# Patient Record
Sex: Male | Born: 1975
Health system: Southern US, Community
[De-identification: ages and names within clinical notes are randomized; demographics above are authoritative.]

## PROBLEM LIST (undated history)

## (undated) DIAGNOSIS — F513 Sleepwalking [somnambulism]: Secondary | ICD-10-CM

## (undated) DIAGNOSIS — I10 Essential (primary) hypertension: Secondary | ICD-10-CM

## (undated) DIAGNOSIS — Z720 Tobacco use: Secondary | ICD-10-CM

## (undated) DIAGNOSIS — Z87891 Personal history of nicotine dependence: Secondary | ICD-10-CM

## (undated) HISTORY — PX: WISDOM TOOTH EXTRACTION: SHX21

## (undated) HISTORY — DX: Essential (primary) hypertension: I10

## (undated) HISTORY — DX: Tobacco use: Z72.0

## (undated) HISTORY — DX: Personal history of nicotine dependence: Z87.891

---

## 2005-06-22 ENCOUNTER — Ambulatory Visit (HOSPITAL_BASED_OUTPATIENT_CLINIC_OR_DEPARTMENT_OTHER): Admission: RE | Admit: 2005-06-22 | Discharge: 2005-06-22 | Payer: Self-pay | Admitting: Orthopedic Surgery

## 2005-06-22 ENCOUNTER — Ambulatory Visit (HOSPITAL_COMMUNITY): Admission: RE | Admit: 2005-06-22 | Discharge: 2005-06-22 | Payer: Self-pay | Admitting: Orthopedic Surgery

## 2005-06-25 HISTORY — PX: FOREIGN BODY REMOVAL: SHX962

## 2006-08-01 ENCOUNTER — Ambulatory Visit (HOSPITAL_BASED_OUTPATIENT_CLINIC_OR_DEPARTMENT_OTHER): Admission: RE | Admit: 2006-08-01 | Discharge: 2006-08-01 | Payer: Self-pay | Admitting: Orthopedic Surgery

## 2006-08-01 ENCOUNTER — Encounter (INDEPENDENT_AMBULATORY_CARE_PROVIDER_SITE_OTHER): Payer: Self-pay | Admitting: *Deleted

## 2008-10-21 ENCOUNTER — Encounter (HOSPITAL_COMMUNITY): Admission: RE | Admit: 2008-10-21 | Discharge: 2008-11-20 | Payer: Self-pay | Admitting: Pediatrics

## 2010-05-06 ENCOUNTER — Emergency Department (HOSPITAL_COMMUNITY)
Admission: EM | Admit: 2010-05-06 | Discharge: 2010-05-06 | Payer: Self-pay | Source: Home / Self Care | Admitting: Emergency Medicine

## 2010-05-06 ENCOUNTER — Encounter: Payer: Self-pay | Admitting: Orthopedic Surgery

## 2010-05-08 ENCOUNTER — Ambulatory Visit: Payer: Self-pay | Admitting: Orthopedic Surgery

## 2010-05-08 DIAGNOSIS — IMO0002 Reserved for concepts with insufficient information to code with codable children: Secondary | ICD-10-CM

## 2010-05-08 DIAGNOSIS — S62309A Unspecified fracture of unspecified metacarpal bone, initial encounter for closed fracture: Secondary | ICD-10-CM | POA: Insufficient documentation

## 2010-05-08 DIAGNOSIS — S82109A Unspecified fracture of upper end of unspecified tibia, initial encounter for closed fracture: Secondary | ICD-10-CM

## 2010-05-08 DIAGNOSIS — S62329A Displaced fracture of shaft of unspecified metacarpal bone, initial encounter for closed fracture: Secondary | ICD-10-CM | POA: Insufficient documentation

## 2010-05-10 ENCOUNTER — Telehealth: Payer: Self-pay | Admitting: Orthopedic Surgery

## 2010-05-10 ENCOUNTER — Encounter (HOSPITAL_COMMUNITY)
Admission: RE | Admit: 2010-05-10 | Discharge: 2010-06-09 | Payer: Self-pay | Source: Home / Self Care | Attending: Orthopedic Surgery | Admitting: Orthopedic Surgery

## 2010-05-24 ENCOUNTER — Ambulatory Visit: Payer: Self-pay | Admitting: Orthopedic Surgery

## 2010-06-14 ENCOUNTER — Ambulatory Visit: Payer: Self-pay | Admitting: Orthopedic Surgery

## 2010-07-06 ENCOUNTER — Ambulatory Visit
Admission: RE | Admit: 2010-07-06 | Discharge: 2010-07-06 | Payer: Self-pay | Source: Home / Self Care | Attending: Orthopedic Surgery | Admitting: Orthopedic Surgery

## 2010-07-25 NOTE — Progress Notes (Signed)
Summary: call from therapist  Phone Note Other Incoming   Caller: Occupational therapist Summary of Call: Beth, occupational therapist Jeani Hawking, calling about patient's splint per order.  Needs to clarify degree of flexion.  He is in her office now. [facility]) Initial call taken by: Cammie Sickle,  May 10, 2010 10:46 AM

## 2010-07-25 NOTE — Letter (Signed)
Summary: History form  History form   Imported By: Jacklynn Ganong 05/10/2010 13:25:16  _____________________________________________________________________  External Attachment:    Type:   Image     Comment:   External Document

## 2010-07-25 NOTE — Miscellaneous (Signed)
Summary: Clinical Eval OT  Clinical Eval OT   Imported By: Eugenio Hoes 05/31/2010 16:29:55  _____________________________________________________________________  External Attachment:    Type:   Image     Comment:   External Document

## 2010-07-25 NOTE — Assessment & Plan Note (Signed)
Summary: [facility] ER FX RT FIBIA/FX LEFT HAND/BCBS/BSF   Visit Type:  new patient Referring Provider:  [facility]  CC:  left hand fracture and right knee fracture.  History of Present Illness: I saw Luis Chang in the office today for an initial visit.  He is a 35 years old man with the complaint of:  Left hand fracture and right tibial plateau fracture.  DOI 05-06-10.  Xrays taken [facility] ER.   Medications given by ER: Ibuprofen 800 mg, Norco 5mg .   I reviewed his x-rays that showed that the patient has a lateral plateau fracture, RIGHT, minimal depression.  He also has a index finger , proximal phalanx fracture, and a third metacarpal fracture.  He complains of dull, stabbing pain, which is 6/10 and relieved by Norco 5 mg and ibuprofen. His pain is constant and he was injured falling down some basement stairs on November 12. Initial treatment in the emergency room included crutches. Straight leg brace on the RIGHT splint for the LEFT hand  Allergies (verified): No Known Drug Allergies  Past History:  Past Surgical History: Hand surgery 2006  Review of Systems Constitutional:  Denies weight loss, weight gain, fever, chills, and fatigue. Cardiovascular:  Denies chest pain, palpitations, fainting, and murmurs. Respiratory:  Denies short of breath, wheezing, couch, tightness, pain on inspiration, and snoring . Gastrointestinal:  Denies heartburn, nausea, vomiting, diarrhea, constipation, and blood in your stools. Genitourinary:  Denies frequency, urgency, difficulty urinating, painful urination, flank pain, and bleeding in urine. Neurologic:  Denies numbness, tingling, unsteady gait, dizziness, tremors, and seizure. Musculoskeletal:  Denies joint pain, swelling, instability, stiffness, redness, heat, and muscle pain. Endocrine:  Denies excessive thirst, exessive urination, and heat or cold intolerance. Psychiatric:  Denies nervousness, depression, anxiety, and hallucinations. Skin:  Denies  changes in the skin, poor healing, rash, itching, and redness. HEENT:  Denies blurred or double vision, eye pain, redness, and watering. Immunology:  Denies seasonal allergies, sinus problems, and allergic to bee stings. Hemoatologic:  Denies easy bleeding and brusing.  Physical Exam  Skin:  intact without lesions or rashes, mild abrasions from the fall, none-full-thickness Cervical Nodes:  no significant adenopathy Psych:  alert and cooperative; normal mood and affect; normal attention span and concentration Additional Exam:  tenderness and swelling of the LEFT hand at the index finger, proximal phalanx in the mid to distal shaft of the 3rd metacarpal. There is no deformity.  Rotatory alignment was checked with the passive tenodesis test of both hands.     Knee Exam  General:    Well-developed, well-nourished, normal body habitus; no deformities, normal grooming.  Gait:    abnormal gait pattern with crutches, favoring the RIGHT lower extremity  Inspection:    swelling noted. RIGHT knee with effusion, tenderness over the lateral compartment.  Tenderness over the 2nd proximal phalanx (index finger) and tenderness over the 3rd metacarpal. Mid-distal shaft  Vascular:    There was no swelling or varicose veins. The pulses and temperature are normal. There was no edema or tenderness.  Sensory:    Gross coordination and sensation were normal.    Motor:    Motor strength 5/5 bilaterally for quadriceps, hamstrings, ankle dorsiflexion, and ankle plantar flexion.    Reflexes:    no reflexes were taken  Knee Exam:    Right:    Inspection:  Abnormal    Palpation:  Abnormal    Stability:  stable   Impression & Recommendations:  Problem # 1:  FRACTURE, TIBIAL PLATEAU (  ICD-823.00) Assessment New  recommended range of motion brace, RIGHT knee 0-70 with touchdown weightbearing with crutches for 2 weeks and repeat x-ray.  Splints for the LEFT hand to be made by the hospital. He  is placed in temporary finger splints with aluminum splint today.  Orders: New Patient Level IV ([address])  Problem # 2:  CLOSED FRACTURE OF SHAFT OF METACARPAL BONE (ICD-815.03) Assessment: New  Orders: New Patient Level IV (60454)  Problem # 3:  CLOS FRACTURE MID/PROXIMAL PHALANX/PHALANG HAND (ICD-816.01) Assessment: New  Orders: New Patient Level IV (09811)  Other Orders: Physical Therapy Referral (PT)  Patient Instructions: 1)  Return in 2 weeks for xrays of the left hand and right knee include 10 caudal tilt  2)  toe touch WB with crutches x 2 weeks  3)  Hand splint at Hospital    Orders Added: 1)  Physical Therapy Referral [PT] 2)  New Patient Level IV [91478]

## 2010-07-25 NOTE — Assessment & Plan Note (Signed)
Summary: 2 WK RE-CK/XRAYS LT HAND+RT KNEE,INCLUDE 10 CAUD TILT/BCBS/CAF   Visit Type:  Follow-up Referring Provider:  [facility]  CC:  left hand and right knee fracture.  History of Present Illness: I saw Luis Chang in the office today for a followup visit.  He is a 35 years old man with the complaint of:  left hand and right knee   Left hand fracture and right tibial plateau fracture.  DOI 05-06-10. [18 days later]  Medications given by ER: Ibuprofen 800 mg, Norco 5mg .  he is not using any pain medication. Right now is doing well. He hasn't moved his knee very much despite his brace being a 0-70 he is ambulating with crutches in a hand-based splint for his index finger and long finger fractures. The index fingers. A proximal phalanx fracture and the long finger is a metacarpal neck fracture.  X-rays are obtained of the tibial plateau with 10 caudal tilt showing no depression of the fracture at this point.  The hand shows 2 fractures at the proximal phalanx of the index finger and the head of the 3rd metacarpal or long finger, which are nondisplaced.  The patient can progressively weight-bear as tolerated with protection of brace and crutches and keep his hand-based splint on him when he comes back in 3 weeks. X-rays will be repeated and then we will remove the splint from the hand to start active range of motion exercises and removed. The crutches and use the brace only for an additional 6 weeks  Allergies: No Known Drug Allergies   Impression & Recommendations:  Problem # 1:  FRACTURE, TIBIAL PLATEAU (ICD-823.00)  Orders: Post-Op Check (56213) Knee x-ray,  3 views (08657)  Problem # 2:  CLOS FRACTURE MID/PROXIMAL PHALANX/PHALANG HAND (ICD-816.01)  Orders: Post-Op Check (84696)  Problem # 3:  CLOSED FRACTURE OF SHAFT OF METACARPAL BONE (ICD-815.03)  Orders: Post-Op Check (29528) Hand x-ray, minimum 3 views (73130)  Patient Instructions: 1)  start bending the knee 100 x  a day  2)  wear brace 3 more weeks, start weight bearing  3)  return in 3 weeks 4)  xrays of left hand and right knee    Orders Added: 1)  Post-Op Check [99024] 2)  Knee x-ray,  3 views [73562] 3)  Hand x-ray, minimum 3 views [73130]

## 2010-07-25 NOTE — Letter (Signed)
Summary: Out of Work  [facility]  [address]  [place], [place] [address]   Phone: [phone]  Fax: [phone]    May 08, 2010   Employee:  JENNER ROSIER    To Whom It May Concern:   For Medical reasons, please excuse the above named employee from work for the following dates:  Start:   05/08/10  End/Estimated return to work:   06/23/10 or until further notice   If you need additional information, please feel free to contact our office.         Sincerely,    Terrance Mass, MD

## 2010-07-27 NOTE — Assessment & Plan Note (Signed)
Summary: 3 wk RE-CK/XRAYS LT HAND+RT KNEE/BCBS/CAF   Visit Type:  Follow-up Referring Provider:  [facility]  CC:  left hand and right knee.  History of Present Illness: I saw Luis Chang in the office today for a 3 week followup visit.  He is a 35 years old man with the complaint of:  left hand and right knee  Xrays today.  Left hand fracture and right tibial plateau fracture.  DOI 05-06-10.   Medications: Ibuprofen 800 mg, Norco 5mg .  Today, he is here to recheck the wound range of motion in his RIGHT knee and LEFT hand.  Did not have any difficulties other than some stiffness in the hand.  Exam shows full range of motion without swelling or tenderness in the RIGHT knee. All ligaments are stable.  LEFT hand, there is no rotatory or angular malalignment to the hand. He has near full range of motion with just a little loss of extension at the ring finger.  Overall impression healed tibial plateau fracture, RIGHT knee, and healed fractures of the LEFT hand. Plan discharge follow up as needed  Allergies: No Known Drug Allergies   Impression & Recommendations:  Problem # 1:  CLOSED FRACTURE OF SHAFT OF METACARPAL BONE (ICD-815.03) Assessment Improved  Orders: Post-Op Check ([address])  Problem # 2:  CLOS FRACTURE MID/PROXIMAL PHALANX/PHALANG HAND (ICD-816.01) Assessment: Improved  Orders: Post-Op Check (60454)  Problem # 3:  FRACTURE, TIBIAL PLATEAU (ICD-823.00) Assessment: Improved  Orders: Post-Op Check (09811)  Patient Instructions: 1)  Please schedule a follow-up appointment as needed.   Orders Added: 1)  Post-Op Check [91478]

## 2010-07-27 NOTE — Assessment & Plan Note (Signed)
Summary: 3 wk re-ck/xray LT hand + Rt knee/ bcbs/wkj   Visit Type:  Follow-up Referring Provider:  [facility]  CC:  left hand and right knee.  History of Present Illness: I saw Luis Chang in the office today for a 3 week followup visit.  He is a 35 years old man with the complaint of:  left hand and right knee  Xrays today.  Left hand fracture and right tibial plateau fracture.  DOI 05-06-10.   day 39 today  ~ 6 weeks   Medications given by ER: Ibuprofen 800 mg, Norco 5mg .  The patient has a stable RIGHT knee with full range of motion no swelling or tenderness  X-ray RIGHT knee AP lateral and patella, 10 caudal tilt Lateral plateau fracture no displacement  Impression healed lateral plateau fracture  LEFT hand x-ray proximal phalanx index finger ulnar side fracture and spiral third metacarpal metaphyseal fracture Both fractures nondisplaced no change in position after 6 weeks for impression stable fractures as described   Allergies: No Known Drug Allergies   Impression & Recommendations:  Problem # 1:  CLOSED FRACTURE OF SHAFT OF METACARPAL BONE (ICD-815.03)  the patient's wife tells me he walk without crutches over the last week with the brace on  He should continue bracing additional 3 weeks for transition  He ensures me that he can do his own physical therapy later to his hand of note we checked him for rotation and his rotatory alignment is equal to his opposite hand.  He was concerned about a small bump over the metacarpal head #3 I told this was not enough of an offset to warrant surgery  Return to work  Orders: Post-Op Check [id number]) Hand x-ray, minimum 3 views (73130)  Problem # 2:  CLOS FRACTURE MID/PROXIMAL PHALANX/PHALANG HAND (ICD-816.01)  Orders: Post-Op Check (60454) Hand x-ray, minimum 3 views (73130)  Problem # 3:  FRACTURE, TIBIAL PLATEAU (ICD-823.00)  Orders: Post-Op Check (09811) Knee x-ray complete,  4/more views (91478)   Orders  Added: 1)  Post-Op Check [99024] 2)  Knee x-ray complete,  4/more views [73564] 3)  Hand x-ray, minimum 3 views [73130]

## 2010-11-10 NOTE — Op Note (Signed)
NAME:  RYLYNN, SCHONEMAN                  ACCOUNT NO.:  [id number]   MEDICAL RECORD NO.:  [id number]          PATIENT TYPE:  AMB   LOCATION:  DSC                          FACILITY:  [facility]   PHYSICIAN:  Cindee Salt, M.D.       DATE OF BIRTH:  Jun 11, 1976   DATE OF PROCEDURE:  06/22/2005  DATE OF DISCHARGE:                                 OPERATIVE REPORT   PREOPERATIVE DIAGNOSIS:  Foreign body left hand.   POSTOPERATIVE DIAGNOSIS:  Foreign body left hand.   OPERATION:  Removal foreign body, exploration digital nerve left hand.   SURGEON:  Cindee Salt, M.D.   ASSISTANT:  None.   ANESTHESIA:  General.   HISTORY:  The patient is 35 year old male who suffered an injury to his  right hand when a piece of wood was jammed into it. The injury occurred 1  day prior to being seen. It was seen at a local emergency room where  attempted removal was unsuccessful. He has had numbness and tingling since  that time to the radial digital nerve.   DESCRIPTION OF PROCEDURE:  The patient is brought to the operating room a  general anesthetic carried out without difficulty.  He was prepped and  draped using DuraPrep, supine position, and left arm free. The limb was  exsanguinated with an Esmarch bandage, tourniquet placed high, and the arm  was inflated to 250 mmHg.   An oblique incision was made going through the entrance wound carried  proximally and distally, carried down through subcutaneous tissue. A piece  of wood measuring approximately 1 cm x 0.5 x 0.25 cm in diameter was then  easily removed. The radial digital nerve was then explored. This was found  to be intact. The flexor tendon was not involved. The ulnar digital nerve  was also explored and found to be intact. The wound was copiously irrigated  after cultures were taken for both aerobic and anaerobic cultures.   Following irrigation the incisions were closed. The original entry site was  left open. Sterile compressive dressing was applied.  The patient tolerated  the procedure well; and was taken to the recovery room for observation in  satisfactory condition. He is discharged home to return to the [facility] [place] in 1 week on Vicodin.           ______________________________  Cindee Salt, M.D.     GK/MEDQ  D:  06/22/2005  T:  06/22/2005  Job:  [id number]

## 2010-11-10 NOTE — Op Note (Signed)
NAME:  Luis Chang, Luis Chang                  ACCOUNT NO.:  [id number]   MEDICAL RECORD NO.:  [id number]          PATIENT TYPE:  AMB   LOCATION:  DSC                          FACILITY:  [facility]   PHYSICIAN:  Cindee Salt, M.D.       DATE OF BIRTH:  [birth year]   DATE OF PROCEDURE:  08/01/2006  DATE OF DISCHARGE:                               OPERATIVE REPORT   PREOPERATIVE DIAGNOSIS:  Mass, left index finger.   POSTOPERATIVE DIAGNOSIS:  Mass, left index finger.   OPERATION:  Excision of mass, left index finger.   SURGEON:  Cindee Salt, M.D.   ANESTHESIA:  Forearm based IV regional.   HISTORY:  The patient is a 35 year old male with a history of a mass in  the index finger of his left hand, proximal phalanx.  He is desirous of  removal in that this has become painful and is slowly enlarging.  He is  aware of risks and complications including recurrence, injury to  arteries, nerves, tendons, incomplete relief of symptoms, dystrophy,  plus possibility of infection.  He is desirous of removing this and  questions were encouraged and answered in the preoperative area.  The  extremity marked by both the patient and surgeon.   PROCEDURE:  The patient was brought to the operating room where a  forearm based IV regional anesthetic was carried out without difficulty.  He was prepped using DuraPrep, supine position, left arm free.  After 3-  minute dry time, he was draped.  An oblique incision was made for  extension of a Brunner type incision over the middle phalanx, carried  down through subcutaneous tissue.  A pearly white mass was immediately  encountered.  With blunt and sharp dissection this was dissected free.  The neurovascular bundles were identified and protected.  The specimen  was excised and sent to pathology.  This measured approximately 0.5 cm  in diameter.  It was not opened.  The wound was irrigated.  A metacarpal  block was given with 0.25% Marcaine without epinephrine.  4 mL was used.  The wound was closed after irrigation with interrupted 5-0 nylon  sutures.  Sterile compressive dressing and splint to the finger applied.  The patient tolerated the procedure well.  On deflation of the  tourniquet, all fingers immediately pinked.  He was taken to the  recovery observation in satisfactory condition.  The specimen was sent  to pathology.  He is discharged home to return to [facility] [place] in one week on Vicodin.           ______________________________  Cindee Salt, M.D.     GK/MEDQ  D:  08/01/2006  T:  08/01/2006  Job:  [id number]

## 2011-03-16 ENCOUNTER — Emergency Department: Payer: Self-pay | Admitting: Emergency Medicine

## 2012-02-08 ENCOUNTER — Ambulatory Visit (INDEPENDENT_AMBULATORY_CARE_PROVIDER_SITE_OTHER): Payer: BC Managed Care – PPO | Admitting: Emergency Medicine

## 2012-02-08 ENCOUNTER — Ambulatory Visit: Payer: BC Managed Care – PPO

## 2012-02-08 VITALS — BP 133/83 | HR 75 | Temp 97.9°F | Resp 16 | Ht 71.0 in | Wt 172.6 lb

## 2012-02-08 DIAGNOSIS — M25529 Pain in unspecified elbow: Secondary | ICD-10-CM

## 2012-02-08 DIAGNOSIS — S52599A Other fractures of lower end of unspecified radius, initial encounter for closed fracture: Secondary | ICD-10-CM

## 2012-02-08 DIAGNOSIS — M25539 Pain in unspecified wrist: Secondary | ICD-10-CM

## 2012-02-08 DIAGNOSIS — S52509A Unspecified fracture of the lower end of unspecified radius, initial encounter for closed fracture: Secondary | ICD-10-CM

## 2012-02-08 MED ORDER — HYDROCODONE-ACETAMINOPHEN 5-325 MG PO TABS: 1.0000 | ORAL_TABLET | ORAL | Status: AC | PRN

## 2012-02-08 NOTE — Progress Notes (Signed)
   [facility] [address] [place] [address] Phone: [phone] Fax: [phone]  Date:  02/08/2012   Name:  Luis Chang   DOB:  1976-02-20   MRN:  [id number] Gender: male  Age: 36 y.o.  PCP:  No primary provider on file.    Chief Complaint: Wrist Pain and Hand Pain   History of Present Illness:  Luis Chang is a 36 y.o. pleasant patient who presents with the following:  Playing with kids in yard and raced them to a tree.  Slipped and fell on outstretched arm.  Heard a pop and has pain in right wrist.  Denies other complaints.  Has pain with movement unable to use had at work today    Patient Active Problem List  Diagnosis  . FRACTURE, HAND  . CLOSED FRACTURE OF SHAFT OF METACARPAL BONE  . CLOS FRACTURE MID/PROXIMAL PHALANX/PHALANG HAND  . FRACTURE, TIBIAL PLATEAU    No past medical history on file.  No past surgical history on file.  History  Substance Use Topics  . Smoking status: Current Everyday Smoker -- 2.0 packs/day    Types: Cigarettes  . Smokeless tobacco: Not on file  . Alcohol Use: Not on file    No family history on file.  No Known Allergies  Medication list has been reviewed and updated.  Current Outpatient Prescriptions on File Prior to Visit  Medication Sig Dispense Refill  . lisinopril (PRINIVIL,ZESTRIL) 10 MG tablet Take 10 mg by mouth daily.        Review of Systems:  As per HPI, otherwise negative.    Physical Examination: Filed Vitals:   02/08/12 1511  BP: 133/83  Pulse: 75  Temp: 97.9 F (36.6 C)  Resp: 16   Filed Vitals:   02/08/12 1511  Height: 5\' 11"  (1.803 m)  Weight: 172 lb 9.6 oz (78.291 kg)   Body mass index is 24.07 kg/(m^2). Ideal Body Weight: Weight in (lb) to have BMI = 25: 178.9    GEN: WDWN, NAD, Non-toxic, Alert & Oriented x 3 HEENT: Atraumatic, Normocephalic.  Ears and Nose: No external deformity. EXTR: No clubbing/cyanosis/edema NEURO: Normal gait.  PSYCH: Normally  interactive. Conversant. Not depressed or anxious appearing.  Calm demeanor.  Ext:  Pain and tenderness, swelling right wrist.  No deformity  NATI   Assessment and Plan: Fracture distal radius Sugar tong splint Ortho follow up.  Patient has personal ortho preference and will make appt.  Carmelina Dane, MD   [facility] reading (PRIMARY) by  Dr. Dareen Piano.  Fracture distal radius.

## 2012-02-08 NOTE — Patient Instructions (Addendum)
Wrist Fracture  Your caregiver has diagnosed you as having a fracture of the wrist. A fracture is a break in the bone or bones. A cast or splint is used to protect and keep your injured bone(s) from moving. The cast or splint will usually be on for about 5 to 6 weeks. One of the bones of the wrist (the navicular bone) often does not show up as a fracture on X-ray until later or in the healing phase. With this bone your caregiver will often cast as though it is fractured even if not seen on the X-ray.  HOME CARE INSTRUCTIONS   · To lessen the swelling, keep the injured part elevated while sitting or lying down. Keeping the injury above the level of your heart (the center of the chest) will decrease swelling and pain.  · Do not wear rings or jewelry on the injured hand or wrist.  · Apply ice to the injury for 15 to 20 minutes, 3 to 4 times per day while awake for 2 days. Put the ice in a plastic bag and place a thin towel between the bag of ice and your cast.  · If you have a plaster or fiberglass cast:  · Do not try to scratch the skin under the cast using sharp or pointed objects.  · Check the skin around the cast every day. You may put lotion on any red or sore areas.  · Keep your cast dry and clean.  · If you have a plaster splint:  · Wear the splint as directed.  · You may loosen the elastic around the splint if your fingers become numb, tingle, or turn cold or blue.  · If you have been put in a removable splint, wear and use as directed.  · Do not use powders or deodorants in or around the cast or splint.  · Do not remove padding from your cast or splint.  · Do not put pressure on any part of your cast or splint. It may break. Rest your cast or splint only on a pillow the first 24 hours until it is fully hardened.  · Gently move your fingers often, so they do not get stiff.  · Do not remove the splint unless directed by your caregiver. Casts must be removed by an orthopedist.  · Your cast or splint can be  protected during bathing with a plastic bag. Do not lower the cast or splint into water.  · Only take over-the-counter or prescription medicines for pain, discomfort, or fever as directed by your caregiver.  · Follow up with your caregiver as directed.  SEEK IMMEDIATE MEDICAL CARE IF:   · Your cast or splint gets damaged or breaks.  · Your cast or splint feels too tight or loose.  · You have increased pain, not controlled with medication.  · You have increased swelling.  · Your skin or nails below the injury turn blue or grey or feel cold or numb.  · You have trouble moving or feeling your fingers.  · You experience any burning or stinging from the cast or splint.  · There is a bad smell coming from under the cast or splint.  · New stains or fluids are coming from under the cast or splint.  · You have any new injuries while wearing the cast or splint.  Document Released: 03/21/2005 Document Revised: 05/31/2011 Document Reviewed: 01/08/2007  ExitCare® Patient Information ©2012 [employer].

## 2012-02-13 ENCOUNTER — Ambulatory Visit (INDEPENDENT_AMBULATORY_CARE_PROVIDER_SITE_OTHER): Payer: BC Managed Care – PPO | Admitting: Orthopedic Surgery

## 2012-02-13 ENCOUNTER — Encounter: Payer: Self-pay | Admitting: Orthopedic Surgery

## 2012-02-13 VITALS — BP 116/70 | Ht 71.0 in | Wt 172.0 lb

## 2012-02-13 DIAGNOSIS — S52599A Other fractures of lower end of unspecified radius, initial encounter for closed fracture: Secondary | ICD-10-CM

## 2012-02-13 DIAGNOSIS — M79609 Pain in unspecified limb: Secondary | ICD-10-CM

## 2012-02-13 DIAGNOSIS — M25539 Pain in unspecified wrist: Secondary | ICD-10-CM

## 2012-02-13 DIAGNOSIS — S52509A Unspecified fracture of the lower end of unspecified radius, initial encounter for closed fracture: Secondary | ICD-10-CM

## 2012-02-13 NOTE — Progress Notes (Signed)
  Subjective:    Patient ID: Luis Chang, male    DOB: [birth year], 36 y.o.   MRN: [id number]  Wrist Injury  The incident occurred 5 to 7 days ago. The incident occurred at home. The injury mechanism was a fall. The pain is present in the right wrist. The quality of the pain is described as aching. The pain does not radiate. The pain is at a severity of 3/10. The pain has been fluctuating since the incident.      Review of Systems  All other systems reviewed and are negative.       Objective:   Physical Exam  Nursing note and vitals reviewed. Constitutional: He is oriented to person, place, and time. He appears well-developed and well-nourished.  HENT:  Head: Normocephalic.  Cardiovascular: Normal rate and intact distal pulses.   Pulmonary/Chest: No respiratory distress.  Abdominal: He exhibits no distension.  Neurological: He is alert and oriented to person, place, and time. He has normal reflexes. No cranial nerve deficit. He exhibits normal muscle tone. Coordination normal.  Skin: Skin is warm and dry. No rash noted. No erythema. No pallor.  Psychiatric: He has a normal mood and affect. His behavior is normal. Judgment and thought content normal.  Right Hand Exam  Right hand exam is normal.  Comments:  Right wrist  Tender no swelling  Skin intact ROM PROM is normal  Strength normal  Joint stability normal    Left Hand Exam  Left hand exam is normal.   Right Elbow Exam  Right elbow exam is normal.   Left Elbow Exam  Left elbow exam is normal.   Right Shoulder Exam  Right shoulder exam is normal.   Left Shoulder Exam  Left shoulder exam is normal.       Imaging: Non displaced distal radius fracture      Assessment & Plan:  closedd ND right D radius fracture   Fracture brace / Warm and form x 6 weeks then x rays

## 2012-02-13 NOTE — Patient Instructions (Addendum)
Brace x 6 weeks   Ok to shower

## 2012-03-26 ENCOUNTER — Ambulatory Visit: Payer: BC Managed Care – PPO | Admitting: Orthopedic Surgery

## 2012-03-26 ENCOUNTER — Encounter: Payer: Self-pay | Admitting: Orthopedic Surgery

## 2012-03-26 ENCOUNTER — Telehealth: Payer: Self-pay | Admitting: Orthopedic Surgery

## 2012-03-26 NOTE — Telephone Encounter (Signed)
Patient(wife)called 03/25/12, cancelled appointment for 03/26/12 for now, due to his work schedule.  Offered re-schedule; wife states patient will need to call back when he can arrange it with work.  I also called back to patient's cell #[phone], and left message offering re-schedule; letter sent, as no return call back as of yet.

## 2013-06-12 ENCOUNTER — Ambulatory Visit: Payer: BC Managed Care – PPO | Admitting: Family Medicine

## 2013-08-22 ENCOUNTER — Emergency Department (HOSPITAL_COMMUNITY)
Admission: EM | Admit: 2013-08-22 | Discharge: 2013-08-22 | Disposition: A | Discharge status: Home / Self Care | Payer: BC Managed Care – PPO | Attending: Emergency Medicine | Admitting: Emergency Medicine

## 2013-08-22 ENCOUNTER — Emergency Department (HOSPITAL_COMMUNITY): Payer: BC Managed Care – PPO

## 2013-08-22 ENCOUNTER — Encounter (HOSPITAL_COMMUNITY): Payer: Self-pay | Admitting: Emergency Medicine

## 2013-08-22 DIAGNOSIS — Y9289 Other specified places as the place of occurrence of the external cause: Secondary | ICD-10-CM | POA: Insufficient documentation

## 2013-08-22 DIAGNOSIS — T5291XA Toxic effect of unspecified organic solvent, accidental (unintentional), initial encounter: Secondary | ICD-10-CM | POA: Insufficient documentation

## 2013-08-22 DIAGNOSIS — F17200 Nicotine dependence, unspecified, uncomplicated: Secondary | ICD-10-CM | POA: Insufficient documentation

## 2013-08-22 DIAGNOSIS — T5994XA Toxic effect of unspecified gases, fumes and vapors, undetermined, initial encounter: Secondary | ICD-10-CM

## 2013-08-22 DIAGNOSIS — Y9389 Activity, other specified: Secondary | ICD-10-CM | POA: Insufficient documentation

## 2013-08-22 DIAGNOSIS — I10 Essential (primary) hypertension: Secondary | ICD-10-CM | POA: Insufficient documentation

## 2013-08-22 DIAGNOSIS — T59891A Toxic effect of other specified gases, fumes and vapors, accidental (unintentional), initial encounter: Secondary | ICD-10-CM | POA: Insufficient documentation

## 2013-08-22 DIAGNOSIS — R05 Cough: Secondary | ICD-10-CM | POA: Insufficient documentation

## 2013-08-22 DIAGNOSIS — R059 Cough, unspecified: Secondary | ICD-10-CM | POA: Insufficient documentation

## 2013-08-22 DIAGNOSIS — R0602 Shortness of breath: Secondary | ICD-10-CM | POA: Insufficient documentation

## 2013-08-22 MED ORDER — PREDNISONE 50 MG PO TABS
60.0000 mg | ORAL_TABLET | Freq: Once | ORAL | Status: AC
  Administered 2013-08-22: 60 mg via ORAL
  Filled 2013-08-22 (×2): qty 1

## 2013-08-22 MED ORDER — ALBUTEROL SULFATE (2.5 MG/3ML) 0.083% IN NEBU
5.0000 mg | INHALATION_SOLUTION | Freq: Once | RESPIRATORY_TRACT | Status: AC
  Administered 2013-08-22: 5 mg via RESPIRATORY_TRACT
  Filled 2013-08-22: qty 6

## 2013-08-22 MED ORDER — AEROCHAMBER Z-STAT PLUS/MEDIUM MISC: 1.0000 | Freq: Once | Status: DC

## 2013-08-22 MED ORDER — IPRATROPIUM BROMIDE 0.02 % IN SOLN
0.5000 mg | Freq: Once | RESPIRATORY_TRACT | Status: AC
  Administered 2013-08-22: 0.5 mg via RESPIRATORY_TRACT
  Filled 2013-08-22: qty 2.5

## 2013-08-22 MED ORDER — ALBUTEROL SULFATE HFA 108 (90 BASE) MCG/ACT IN AERS
2.0000 | INHALATION_SPRAY | RESPIRATORY_TRACT | Status: DC | PRN
  Administered 2013-08-22: 2 via RESPIRATORY_TRACT
  Filled 2013-08-22: qty 6.7

## 2013-08-22 MED ORDER — ALBUTEROL (5 MG/ML) CONTINUOUS INHALATION SOLN
10.0000 mg/h | INHALATION_SOLUTION | Freq: Once | RESPIRATORY_TRACT | Status: AC
  Administered 2013-08-22: 10 mg/h via RESPIRATORY_TRACT
  Filled 2013-08-22: qty 20

## 2013-08-22 MED ORDER — ACETAMINOPHEN 325 MG PO TABS
650.0000 mg | ORAL_TABLET | Freq: Once | ORAL | Status: AC
  Administered 2013-08-22: 650 mg via ORAL
  Filled 2013-08-22: qty 2

## 2013-08-22 MED ORDER — PREDNISONE 20 MG PO TABS: ORAL_TABLET | ORAL | Status: DC

## 2013-08-22 NOTE — ED Provider Notes (Signed)
CSN: [id number]     Arrival date & time 08/22/13  1835 History   First MD Initiated Contact with Patient 08/22/13 1839     Chief Complaint  Patient presents with  . Toxic Inhalation     (Consider location/radiation/quality/duration/timing/severity/associated sxs/prior Treatment) HPI Patient reports today they were using industrial bright to clean a motor of some heavy equipment at work. He states he's used it before however today because it was colder they had to keep the shop doors closed. He states he sprayed it on the motor and then blew it off with an air hose. He was doing this for about 20 minutes. This was about 1:30 PM. He states shortly afterwards he started feeling short of breath. He states he coughs when he tries to take a big deep breath. He denies any burning in his throat or oropharynx. He denies any swelling of his tongue or throat. He denies having wheezing. He states he is having a cough with intermittent yellow sputum production and also sometimes dry. He had nausea earlier right after he used a chemical but not now. He denies vomiting or diarrhea. His wife states he came home from work and slept for 2  Hours which is not like him.  Patient smokes 2 packs per day.    PCP Dr Webb LawsHalm's office  Past Medical History  Diagnosis Date  . HTN (hypertension)    Past Surgical History  Procedure Laterality Date  . Foreign body removal  2007    RIGHT HAND  . Wisdom tooth extraction     Family History  Problem Relation Age of Onset  . Arthritis     History  Substance Use Topics  . Smoking status: Current Every Day Smoker -- 2.00 packs/day    Types: Cigarettes  . Smokeless tobacco: Not on file  . Alcohol Use: Yes     Comment: OCCASIONALLY  employed Lives with spouse  Review of Systems  All other systems reviewed and are negative.      Allergies  Review of patient's allergies indicates no known allergies.  Home Medications  none  BP 155/96  Pulse 93  Resp 18   SpO2 96%  Vital signs normal   Physical Exam  Nursing note and vitals reviewed. Constitutional: He is oriented to person, place, and time. He appears well-developed and well-nourished.  Non-toxic appearance. He does not appear ill. No distress.  HENT:  Head: Normocephalic and atraumatic.  Right Ear: External ear normal.  Left Ear: External ear normal.  Nose: Nose normal. No mucosal edema or rhinorrhea.  Mouth/Throat: Oropharynx is clear and moist and mucous membranes are normal. No dental abscesses or uvula swelling.  Diffuse redness of his soft palate c/w smoking  Eyes: Conjunctivae and EOM are normal. Pupils are equal, round, and reactive to light.  Neck: Normal range of motion and full passive range of motion without pain. Neck supple.  Cardiovascular: Normal rate, regular rhythm and normal heart sounds.  Exam reveals no gallop and no friction rub.   No murmur heard. Pulmonary/Chest: Effort normal. No respiratory distress. He has decreased breath sounds. He has no wheezes. He has no rhonchi. He has no rales. He exhibits no tenderness and no crepitus.  Pt unable to breathe even moderately without coughing  Abdominal: Soft. Normal appearance and bowel sounds are normal. He exhibits no distension. There is no tenderness. There is no rebound and no guarding.  Musculoskeletal: Normal range of motion. He exhibits no edema and no tenderness.  Moves  all extremities well.   Neurological: He is alert and oriented to person, place, and time. He has normal strength. No cranial nerve deficit.  Skin: Skin is warm, dry and intact. No rash noted. No erythema. No pallor.  Psychiatric: He has a normal mood and affect. His speech is normal and behavior is normal. His mood appears not anxious.    ED Course  Procedures (including critical care time)  Medications  albuterol (PROVENTIL HFA;VENTOLIN HFA) 108 (90 BASE) MCG/ACT inhaler 2 puff (not administered)  aerochamber Z-Stat Plus/medium 1 each (not  administered)  albuterol (PROVENTIL,VENTOLIN) solution continuous neb (10 mg/hr Nebulization Given 08/22/13 1905)  ipratropium (ATROVENT) nebulizer solution 0.5 mg (0.5 mg Nebulization Given 08/22/13 1905)  acetaminophen (TYLENOL) tablet 650 mg (650 mg Oral Given 08/22/13 2119)  albuterol (PROVENTIL) (2.5 MG/3ML) 0.083% nebulizer solution 5 mg (5 mg Nebulization Given 08/22/13 2123)  ipratropium (ATROVENT) nebulizer solution 0.5 mg (0.5 mg Nebulization Given 08/22/13 2123)  predniSONE (DELTASONE) tablet 60 mg (60 mg Oral Given 08/22/13 2119)    20:10 Pt still getting his continuous nebulizer, feeling better, is able to breathe without coughing. Wife brought in the can of [employer]ource brake parts cleaner made in [place], [place].  20:17 Poison Control, [place] Standard, states is a hydrocarbon, usually acetone, states it is respiratory and mucus membrane irritatant, agrees with nebulizer.   Recheck 21:10 almost finished with continuous nebulizer. Patient has improved deep breathing. He's now able to breathe deeply without coughing. He is noted to have some scattered lower pitched rhonchi. He states he feels better but he still has some shortness of breath. He will get a second regular nebulizer treatment.  Recheck at 2200 patient has improved air movement. He has rare low pitched rhonchi. He is going to be ambulated by nursing staff with pulse ox monitoring. If he does not feel worse he can be discharged.  His pulse ox was 97-99% on RA. Pt ready for discharge.   Labs Review Labs Reviewed - No data to display Imaging Review Dg Chest 2 View  08/22/2013   CLINICAL DATA:  Cough and shortness of breath. Wheezing. Toxic inhalation. Every day smoker.  EXAM: CHEST  2 VIEW  COMPARISON:  None.  FINDINGS: Midline trachea. Normal heart size and mediastinal contours. No pleural effusion or pneumothorax. Diffuse peribronchial thickening. Clear lungs. Tiny radiopaque density projects over the left-sided chest on the frontal  radiograph. This could be artifactual or external to the patient.  IMPRESSION: 1.  No acute cardiopulmonary disease. 2. Mild peribronchial thickening which may relate to chronic bronchitis or smoking.   Electronically Signed   By: Jeronimo Greaves M.D.   On: 08/22/2013 19:33     EKG Interpretation None      MDM   Final diagnoses:  Toxic inhalation injury    New Prescriptions   PREDNISONE (DELTASONE) 20 MG TABLET    Take 3 po QD x 2d starting tomorrow, then 2 po QD x 3d then 1 po QD x 3d    Plan discharge   Devoria Albe, MD, Franz Dell, MD 08/22/13 2211

## 2013-08-22 NOTE — ED Notes (Signed)
Patient sitting on bed; continuous neb in progress.  Family at bedside.  Patient inhaled brake parts cleaner.

## 2013-08-22 NOTE — ED Notes (Signed)
Discharge instructions and prescription for Prednisone given and reviewed with patient.  Patient verbalized understanding to take Prednisone until gone and to use inhaler PRN for wheezing or shortness of breath.  Patient denies pain at this time; discharged home in good condition.

## 2013-08-22 NOTE — ED Notes (Signed)
Pt states he was cleaning a motor and inhaled spray cleaner. This occurred at 1300. States he took a 2 hour nap and woke up sob. Family to bring in can of cleaner.

## 2013-08-22 NOTE — Discharge Instructions (Signed)
Use the inhaler as needed every 4 -6 hrs for wheezing or shortness of breath. Take the prednisone until gone. TRY TO STOP SMOKING!!! Return if you get a fever, cough up consistently yellow or green mucus or your struggle to breathe.

## 2013-08-22 NOTE — ED Notes (Signed)
Continuous neb completed.  Patient medicated per order.  Significant other remains at bedside.  Patient awaiting second neb from RT.

## 2014-02-04 DIAGNOSIS — F17200 Nicotine dependence, unspecified, uncomplicated: Secondary | ICD-10-CM | POA: Insufficient documentation

## 2015-06-09 ENCOUNTER — Telehealth: Payer: Self-pay | Admitting: Orthopedic Surgery

## 2015-06-09 NOTE — Telephone Encounter (Signed)
Patient/wife called to inquire about appointment for new problem, "pop" and pain, right leg/thigh/knee, which occurred Friday, 06/03/15.  States he tried to call, however, our office had already closed.  Laubach SpanielSaid he does not wish to go to Emergency room or urgent care at that time.  States over next few days pain and swelling recurring, so he contacted primary care; said was advised that he "wouldn't be able to do anything but refer him to a specialist, orthopedic/sports medicine."  Currently, patient has a "knot" and a bruise around knee and thigh, and have called to request immediate appointment.  Relayed that Dr Romeo AppleHarrison is out of the office until next week, and recommended [facility] Emergency room or Urgent care if primary care physician will not see him, Versus waiting to be seen at our office. I also offered our next available appointment, which patient's wife said patient won't schedule, but will rather go ahead call some other orthopedics/sports medicine providers to try to be seen today or tomorrow.  Aware to call back if decides to schedule here as discussed.

## 2016-03-02 ENCOUNTER — Ambulatory Visit (INDEPENDENT_AMBULATORY_CARE_PROVIDER_SITE_OTHER): Payer: BLUE CROSS/BLUE SHIELD

## 2016-03-02 ENCOUNTER — Ambulatory Visit (INDEPENDENT_AMBULATORY_CARE_PROVIDER_SITE_OTHER): Payer: BLUE CROSS/BLUE SHIELD | Admitting: Orthopedic Surgery

## 2016-03-02 VITALS — BP 129/93 | HR 72 | Ht 71.0 in | Wt 175.0 lb

## 2016-03-02 DIAGNOSIS — M25521 Pain in right elbow: Secondary | ICD-10-CM

## 2016-03-02 DIAGNOSIS — M7711 Lateral epicondylitis, right elbow: Secondary | ICD-10-CM

## 2016-03-02 MED ORDER — ETODOLAC 400 MG PO TABS: 400.0000 mg | ORAL_TABLET | Freq: Two times a day (BID) | ORAL | 1 refills | Status: DC

## 2016-03-02 NOTE — Progress Notes (Signed)
Chief Complaint  Patient presents with  . Arm Pain    Right elbow pain, no injury.   HPI 40 year old male presents for evaluation of his right elbow and forearm complains of pain. 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 He's had several weeks of proximally 64 out of 10 constant activity related throbbing aching pain in his right elbow and forearm. He did try some Advil. Seems to be worse with activities and lifting. He reported his review of systems is completely negative  Review of Systems  All other systems reviewed and are negative.   Past Medical History:  Diagnosis Date  . HTN (hypertension)     Past Surgical History:  Procedure Laterality Date  . FOREIGN BODY REMOVAL  2007   RIGHT HAND  . WISDOM TOOTH EXTRACTION     Family History  Problem Relation Age of Onset  . Arthritis     Social History  Substance Use Topics  . Smoking status: Current Every Day Smoker    Packs/day: 2.00    Types: Cigarettes  . Smokeless tobacco: Not on file  . Alcohol use Yes     Comment: OCCASIONALLY   No outpatient prescriptions have been marked as taking for the 03/02/16 encounter (Office Visit) with Vickki HearingStanley E Mekia Dipinto, MD.    BP (!) 129/93   Pulse 72   Ht 5\' 11"  (1.803 m)   Wt 175 lb (79.4 kg)   BMI 24.41 kg/m   Physical Exam  Constitutional: He is oriented to person, place, and time. He appears well-developed and well-nourished. No distress.  Cardiovascular: Normal rate and intact distal pulses.   Neurological: He is alert and oriented to person, place, and time.  Skin: Skin is warm and dry. No rash noted. He is not diaphoretic. No erythema. No pallor.  Psychiatric: He has a normal mood and affect. His behavior is normal. Judgment and thought content normal.    Ortho Exam Ambulation is normal  Right elbow tenderness over the lateral epicondyles painful wrist extension normal elbow motion normal elbow stability normal elbow  strength normal skin normal pulse radial artery. Normal sensation.  Other elbow normal range of motion  ASSESSMENT: My personal interpretation of the images:  Bone spur over the olecranon mild soft tissue swelling there x-ray otherwise normal    PLAN Tennis elbow brace, injection, ice, anti-inflammatory follow-up as needed  Fuller CanadaStanley Koree Staheli, MD 03/02/2016 11:00 AM  .meds

## 2016-03-02 NOTE — Patient Instructions (Addendum)
You have received an injection of steroids into the joint. 15% of patients will have increased pain within the 24 hours postinjection.   This is transient and will go away.   We recommend that you use ice packs on the injection site for 20 minutes every 2 hours and extra strength Tylenol 2 tablets every 8 as needed until the pain resolves.  If you continue to have pain after taking the Tylenol and using the ice please call the office for further instructions.   Start Lodine take 1 tablet twice a day for 6 weeks  Apply ice to the elbow at the end of every day for 20 minutes  Wear tennis elbow brace for all activity   Tennis Elbow Tennis elbow (lateral epicondylitis) is inflammation of the outer tendons of your forearm close to your elbow. Your tendons attach your muscles to your bones. The outer tendons of your forearm are used to extend your wrist, and they attach on the outside part of your elbow. Tennis elbow is often found in people who play tennis, but anyone may get the condition from repeatedly extending the wrist or turning the forearm. CAUSES This condition is caused by repeatedly extending your wrist and using your hands. It can result from sports or work that requires repetitive forearm movements. Tennis elbow may also be caused by an injury. RISK FACTORS You have a higher risk of developing tennis elbow if you play tennis or another racquet sport. You also have a higher risk if you frequently use your hands for work. This condition is also more likely to develop in:  Musicians.  Carpenters, painters, and plumbers.  Cooks.  Cashiers.  People who work in [employer].  Holiday representative workers.  Butchers.  People who use computers. SYMPTOMS Symptoms of this condition include:  Pain and tenderness in your forearm and the outer part of your elbow. You may only feel the pain when you use your arm, or you may feel it even when you are not using your arm.  A burning feeling  that runs from your elbow through your arm.  Weak grip in your hands. DIAGNOSIS  This condition may be diagnosed by medical history and physical exam. You may also have other tests, including:  X-rays.  MRI. TREATMENT Your health care provider will recommend lifestyle adjustments, such as resting and icing your arm. Treatment may also include:  Medicines for inflammation. This may include shots of cortisone if your pain continues.  Physical therapy. This may include massage or exercises.  An elbow brace. Surgery may eventually be recommended if your pain does not go away with treatment. HOME CARE INSTRUCTIONS Activity  Rest your elbow and wrist as directed by your health care provider. Try to avoid any activities that caused the problem until your health care provider says that you can do them again.  If a physical therapist teaches you exercises, do all of them as directed.  If you lift an object, lift it with your palm facing upward. This lowers the stress on your elbow. Lifestyle  If your tennis elbow is caused by sports, check your equipment and make sure that:  You are using it correctly.  It is the best fit for you.  If your tennis elbow is caused by work, take breaks frequently, if you are able. Talk with your manager about how to best perform tasks in a way that is safe.  If your tennis elbow is caused by computer use, talk with your manager about  any changes that can be made to your work environment. General Instructions  If directed, apply ice to the painful area:  Put ice in a plastic bag.  Place a towel between your skin and the bag.  Leave the ice on for 20 minutes, 2-3 times per day.  Take medicines only as directed by your health care provider.  If you were given a brace, wear it as directed by your health care provider.  Keep all follow-up visits as directed by your health care provider. This is important. SEEK MEDICAL CARE IF:  Your pain does not  get better with treatment.  Your pain gets worse.  You have numbness or weakness in your forearm, hand, or fingers.   This information is not intended to replace advice given to you by your health care provider. Make sure you discuss any questions you have with your health care provider.   Document Released: 06/11/2005 Document Revised: 10/26/2014 Document Reviewed: 06/07/2014 Elsevier Interactive Patient Education [employer]2016 [employer].

## 2016-10-07 ENCOUNTER — Emergency Department (HOSPITAL_COMMUNITY): Payer: BLUE CROSS/BLUE SHIELD

## 2016-10-07 ENCOUNTER — Emergency Department (HOSPITAL_COMMUNITY)
Admission: EM | Admit: 2016-10-07 | Discharge: 2016-10-07 | Disposition: A | Discharge status: Home / Self Care | Payer: BLUE CROSS/BLUE SHIELD | Attending: Emergency Medicine | Admitting: Emergency Medicine

## 2016-10-07 ENCOUNTER — Encounter (HOSPITAL_COMMUNITY): Payer: Self-pay | Admitting: *Deleted

## 2016-10-07 DIAGNOSIS — Z79899 Other long term (current) drug therapy: Secondary | ICD-10-CM | POA: Insufficient documentation

## 2016-10-07 DIAGNOSIS — Y9301 Activity, walking, marching and hiking: Secondary | ICD-10-CM | POA: Diagnosis not present

## 2016-10-07 DIAGNOSIS — I10 Essential (primary) hypertension: Secondary | ICD-10-CM | POA: Diagnosis not present

## 2016-10-07 DIAGNOSIS — S0990XA Unspecified injury of head, initial encounter: Secondary | ICD-10-CM | POA: Diagnosis present

## 2016-10-07 DIAGNOSIS — W19XXXA Unspecified fall, initial encounter: Secondary | ICD-10-CM

## 2016-10-07 DIAGNOSIS — Y999 Unspecified external cause status: Secondary | ICD-10-CM | POA: Insufficient documentation

## 2016-10-07 DIAGNOSIS — F17210 Nicotine dependence, cigarettes, uncomplicated: Secondary | ICD-10-CM | POA: Diagnosis not present

## 2016-10-07 DIAGNOSIS — Y92002 Bathroom of unspecified non-institutional (private) residence single-family (private) house as the place of occurrence of the external cause: Secondary | ICD-10-CM | POA: Insufficient documentation

## 2016-10-07 DIAGNOSIS — W1809XA Striking against other object with subsequent fall, initial encounter: Secondary | ICD-10-CM | POA: Diagnosis not present

## 2016-10-07 DIAGNOSIS — S0101XA Laceration without foreign body of scalp, initial encounter: Secondary | ICD-10-CM | POA: Diagnosis not present

## 2016-10-07 HISTORY — DX: Sleepwalking (somnambulism): F51.3

## 2016-10-07 LAB — CBC WITH DIFFERENTIAL/PLATELET
BASOS ABS: 0.1 10*3/uL (ref 0.0–0.1)
Basophils Relative: 1 %
Eosinophils Absolute: 0.3 10*3/uL (ref 0.0–0.7)
Eosinophils Relative: 2 %
HEMATOCRIT: 46.5 % (ref 39.0–52.0)
HEMOGLOBIN: 16.5 g/dL (ref 13.0–17.0)
LYMPHS PCT: 19 %
Lymphs Abs: 3 10*3/uL (ref 0.7–4.0)
MCH: 32.7 pg (ref 26.0–34.0)
MCHC: 35.5 g/dL (ref 30.0–36.0)
MCV: 92.3 fL (ref 78.0–100.0)
MONO ABS: 0.8 10*3/uL (ref 0.1–1.0)
Monocytes Relative: 5 %
NEUTROS ABS: 11.3 10*3/uL — AB (ref 1.7–7.7)
Neutrophils Relative %: 73 %
Platelets: 197 10*3/uL (ref 150–400)
RBC: 5.04 MIL/uL (ref 4.22–5.81)
RDW: 13 % (ref 11.5–15.5)
WBC: 15.4 10*3/uL — AB (ref 4.0–10.5)

## 2016-10-07 LAB — BASIC METABOLIC PANEL
ANION GAP: 10 (ref 5–15)
BUN: 12 mg/dL (ref 6–20)
CO2: 23 mmol/L (ref 22–32)
Calcium: 8.9 mg/dL (ref 8.9–10.3)
Chloride: 108 mmol/L (ref 101–111)
Creatinine, Ser: 0.9 mg/dL (ref 0.61–1.24)
GFR calc Af Amer: >60 mL/min (ref >60)
GFR calc non Af Amer: >60 mL/min (ref >60)
GLUCOSE: 99 mg/dL (ref 65–99)
POTASSIUM: 3.7 mmol/L (ref 3.5–5.1)
Sodium: 141 mmol/L (ref 135–145)

## 2016-10-07 LAB — TROPONIN I

## 2016-10-07 LAB — ETHANOL: Alcohol, Ethyl (B): 116 mg/dL — ABNORMAL HIGH (ref <5)

## 2016-10-07 MED ORDER — POVIDONE-IODINE 10 % EX SOLN
CUTANEOUS | Status: AC
  Filled 2016-10-07: qty 118

## 2016-10-07 MED ORDER — ONDANSETRON HCL 4 MG/2ML IJ SOLN
4.0000 mg | Freq: Once | INTRAMUSCULAR | Status: AC
  Administered 2016-10-07: 4 mg via INTRAVENOUS
  Filled 2016-10-07: qty 2

## 2016-10-07 MED ORDER — LIDOCAINE HCL (PF) 2 % IJ SOLN
INTRAMUSCULAR | Status: AC
  Filled 2016-10-07: qty 10

## 2016-10-07 NOTE — ED Triage Notes (Signed)
Pt states that he has hx of sleep walking and was sleep waking tonight when he fell, wife at bedside reports that pt fell twice, unsure of LOC, pt has laceration noted to back of head, bleeding controlled,

## 2016-10-07 NOTE — Discharge Instructions (Signed)
Follow-up with your doctor for staple removal in 1 week. He also should follow up for an echocardiogram of the heart to make sure the pumping function is normal. Return to the ED if he develop new or worsening symptoms including chest pain, shortness of breath, confusion, persistent vomiting, or any other concerns.

## 2016-10-07 NOTE — ED Provider Notes (Signed)
[facility]Y DEPT Provider Note   CSN: [id number] Arrival date & time: 10/07/16  2952  By signing my name below, I, Elder Negus, attest that this documentation has been prepared under the direction and in the presence of Glynn Octave, MD. Electronically Signed: Elder Negus, Scribe. 10/07/16. 1:11 AM.   History   Chief Complaint Chief Complaint  Patient presents with  . Fall    HPI Luis Chang is a 41 y.o. male with history of HTN who presents to the ED following a fall. The patient states that he has a long history of sleepwalking. Tonight the wife states that he was sleepwalking and fell in the bathroom. Struck his head. He cannot recall those events. Had post-traumatic vomiting and 1x diarrhea. Also reported headache and blurred vision. At interview, continues to report mild head pain. No other injuries. He does admit to "around 8 beers tonight". He is an everyday drinker. No other drug use. No active nausea.  The history is provided by the patient. No language interpreter was used.    Past Medical History:  Diagnosis Date  . HTN (hypertension)   . Sleep walking     Patient Active Problem List   Diagnosis Date Noted  . Distal radial fracture 02/13/2012  . FRACTURE, HAND 05/08/2010  . CLOSED FRACTURE OF SHAFT OF METACARPAL BONE 05/08/2010  . CLOS FRACTURE MID/PROXIMAL PHALANX/PHALANG HAND 05/08/2010  . FRACTURE, TIBIAL PLATEAU 05/08/2010    Past Surgical History:  Procedure Laterality Date  . FOREIGN BODY REMOVAL  2007   RIGHT HAND  . WISDOM TOOTH EXTRACTION         Home Medications    Prior to Admission medications   Medication Sig Start Date End Date Taking? Authorizing Provider  etodolac (LODINE) 400 MG tablet Take 1 tablet (400 mg total) by mouth 2 (two) times daily. 03/02/16   Vickki Hearing, MD  predniSONE (DELTASONE) 20 MG tablet Take 3 po QD x 2d starting tomorrow, then 2 po QD x 3d then 1 po QD x 3d 08/22/13   Devoria Albe, MD    Family  History Family History  Problem Relation Age of Onset  . Arthritis      Social History Social History  Substance Use Topics  . Smoking status: Current Every Day Smoker    Packs/day: 2.00    Types: Cigarettes  . Smokeless tobacco: Never Used  . Alcohol use Yes     Comment: OCCASIONALLY     Allergies   Patient has no known allergies.   Review of Systems Review of Systems  Eyes:       Blurred vision  Respiratory: Negative for shortness of breath.   Cardiovascular: Negative for chest pain.  Gastrointestinal: Positive for diarrhea, nausea and vomiting. Negative for abdominal pain.  Neurological: Positive for syncope (unclear) and headaches.  All other systems reviewed and are negative.    Physical Exam Updated Vital Signs BP 112/80   Pulse 76   Temp 97.6 F (36.4 C)   Resp 16   Ht  (1.803 m)   Wt 175 lb (79.4 kg)   SpO2 99%   BMI 24.41 kg/m   Physical Exam  Constitutional: He is oriented to person, place, and time. He appears well-developed and well-nourished. No distress.  HENT:  Head: Normocephalic.  Mouth/Throat: Oropharynx is clear and moist. No oropharyngeal exudate.  There is a 5cm transverse laceration to the occiput. Bleeding is controlled.   Eyes: Conjunctivae and EOM are normal. Pupils are equal,  round, and reactive to light.  Neck: Normal range of motion. Neck supple.  No meningismus.  Cardiovascular: Normal rate, regular rhythm, normal heart sounds and intact distal pulses.   No murmur heard. Pulmonary/Chest: Effort normal and breath sounds normal. No respiratory distress.  Abdominal: Soft. There is no tenderness. There is no rebound and no guarding.  Musculoskeletal: Normal range of motion. He exhibits no edema.  Diffuse paracervical tenderness.  Neurological: He is alert and oriented to person, place, and time. No cranial nerve deficit. He exhibits normal muscle tone. Coordination normal.   5/5 strength throughout. CN 2-12 intact.Equal  grip strength.   Skin: Skin is warm.  Abrasion to the mid thoracic back.   Psychiatric: He has a normal mood and affect. His behavior is normal.  Nursing note and vitals reviewed.    ED Treatments / Results  Labs (all labs ordered are listed, but only abnormal results are displayed) Labs Reviewed  CBC WITH DIFFERENTIAL/PLATELET - Abnormal; Notable for the following:       Result Value   WBC 15.4 (*)    Neutro Abs 11.3 (*)    All other components within normal limits  ETHANOL - Abnormal; Notable for the following:    Alcohol, Ethyl (B) 116 (*)    All other components within normal limits  BASIC METABOLIC PANEL  TROPONIN I    EKG  EKG Interpretation  Date/Time:  Sunday October 07 2016 03:53:50 EDT Ventricular Rate:  70 PR Interval:    QRS Duration: 90 QT Interval:  460 QTC Calculation: 497 R Axis:   82 Text Interpretation:  Sinus rhythm Borderline prolonged QT interval No significant change was found Confirmed by Manus Gunning  MD, Deatrice Spanbauer [phone]) on 10/07/2016 3:59:10 AM       Radiology Dg Chest 2 View  Result Date: 10/07/2016 CLINICAL DATA:  Status post fall in bathroom while sleepwalking, with concern for chest injury. Initial encounter. EXAM: CHEST  2 VIEW COMPARISON:  Chest radiograph from 12/03/2013 FINDINGS: The lungs are well-aerated and clear. There is no evidence of focal opacification, pleural effusion or pneumothorax. The heart is normal in size; the mediastinal contour is within normal limits. No acute osseous abnormalities are seen. A tiny radiopaque foreign body is noted overlying the left chest wall. IMPRESSION: No acute cardiopulmonary process seen. Electronically Signed   By: Roanna Raider M.D.   On: 10/07/2016 02:20   Ct Head Wo Contrast  Result Date: 10/07/2016 CLINICAL DATA:  Larey Seat while sleepwalking, hitting back of head. Laceration at the back of the head. Concern for head or cervical spine injury. Initial encounter. EXAM: CT HEAD WITHOUT CONTRAST CT CERVICAL  SPINE WITHOUT CONTRAST TECHNIQUE: Multidetector CT imaging of the head and cervical spine was performed following the standard protocol without intravenous contrast. Multiplanar CT image reconstructions of the cervical spine were also generated. COMPARISON:  CT of the head performed 05/06/2010 FINDINGS: CT HEAD FINDINGS Brain: No evidence of acute infarction, hemorrhage, hydrocephalus, extra-axial collection or mass lesion/mass effect. The posterior fossa, including the cerebellum, brainstem and fourth ventricle, is within normal limits. The third and lateral ventricles, and basal ganglia are unremarkable in appearance. The cerebral hemispheres are symmetric in appearance, with normal gray-white differentiation. No mass effect or midline shift is seen. Vascular: No hyperdense vessel or unexpected calcification. Skull: There is no evidence of fracture; visualized osseous structures are unremarkable in appearance. Sinuses/Orbits: The visualized portions of the orbits are within normal limits. The paranasal sinuses and mastoid air cells are well-aerated. Other: No  significant soft tissue abnormalities are seen. CT CERVICAL SPINE FINDINGS Alignment: Normal. Skull base and vertebrae: No acute fracture. No primary bone lesion or focal pathologic process. Soft tissues and spinal canal: No prevertebral fluid or swelling. No visible canal hematoma. Disc levels: Intervertebral disc spaces are preserved. The bony foramina are grossly unremarkable. Upper chest: Scattered blebs are noted at the lung apices. The thyroid gland is unremarkable in appearance. Other: No additional soft tissue abnormalities are seen. IMPRESSION: 1. No evidence of traumatic intracranial injury or fracture. 2. No evidence of fracture or subluxation along the cervical spine. 3. Scattered blebs at the lung apices. Electronically Signed   By: Roanna Raider M.D.   On: 10/07/2016 02:25   Ct Cervical Spine Wo Contrast  Result Date: 10/07/2016 CLINICAL  DATA:  Larey Seat while sleepwalking, hitting back of head. Laceration at the back of the head. Concern for head or cervical spine injury. Initial encounter. EXAM: CT HEAD WITHOUT CONTRAST CT CERVICAL SPINE WITHOUT CONTRAST TECHNIQUE: Multidetector CT imaging of the head and cervical spine was performed following the standard protocol without intravenous contrast. Multiplanar CT image reconstructions of the cervical spine were also generated. COMPARISON:  CT of the head performed 05/06/2010 FINDINGS: CT HEAD FINDINGS Brain: No evidence of acute infarction, hemorrhage, hydrocephalus, extra-axial collection or mass lesion/mass effect. The posterior fossa, including the cerebellum, brainstem and fourth ventricle, is within normal limits. The third and lateral ventricles, and basal ganglia are unremarkable in appearance. The cerebral hemispheres are symmetric in appearance, with normal gray-white differentiation. No mass effect or midline shift is seen. Vascular: No hyperdense vessel or unexpected calcification. Skull: There is no evidence of fracture; visualized osseous structures are unremarkable in appearance. Sinuses/Orbits: The visualized portions of the orbits are within normal limits. The paranasal sinuses and mastoid air cells are well-aerated. Other: No significant soft tissue abnormalities are seen. CT CERVICAL SPINE FINDINGS Alignment: Normal. Skull base and vertebrae: No acute fracture. No primary bone lesion or focal pathologic process. Soft tissues and spinal canal: No prevertebral fluid or swelling. No visible canal hematoma. Disc levels: Intervertebral disc spaces are preserved. The bony foramina are grossly unremarkable. Upper chest: Scattered blebs are noted at the lung apices. The thyroid gland is unremarkable in appearance. Other: No additional soft tissue abnormalities are seen. IMPRESSION: 1. No evidence of traumatic intracranial injury or fracture. 2. No evidence of fracture or subluxation along the  cervical spine. 3. Scattered blebs at the lung apices. Electronically Signed   By: Roanna Raider M.D.   On: 10/07/2016 02:25    Procedures .Marland KitchenLaceration Repair Date/Time: 10/07/2016 3:42 AM Performed by: Glynn Octave Authorized by: Glynn Octave   Consent:    Consent obtained:  Verbal   Consent given by:  Patient   Risks discussed:  Poor cosmetic result and infection   Alternatives discussed:  No treatment Anesthesia (see MAR for exact dosages):    Anesthesia method:  Local infiltration   Local anesthetic:  Lidocaine 2% WITH epi Laceration details:    Location:  Scalp   Scalp location:  Occipital   Length (cm):  5 Repair type:    Repair type:  Simple Pre-procedure details:    Preparation:  Patient was prepped and draped in usual sterile fashion and imaging obtained to evaluate for foreign bodies Exploration:    Hemostasis achieved with:  Direct pressure and epinephrine Treatment:    Area cleansed with:  Betadine   Amount of cleaning:  Standard   Irrigation solution:  Sterile saline  Irrigation method:  Syringe   Visualized foreign bodies/material removed: no   Skin repair:    Repair method:  Staples   Number of staples:  6 Approximation:    Approximation:  Close Post-procedure details:    Dressing:  Non-adherent dressing   Patient tolerance of procedure:  Tolerated well, no immediate complications   (including critical care time)  Medications Ordered in ED Medications - No data to display   Initial Impression / Assessment and Plan / ED Course  I have reviewed the triage vital signs and the nursing notes.  Pertinent labs & imaging results that were available during my care of the patient were reviewed by me and considered in my medical decision making (see chart for details).     Patient with head injury after episode of questionable sleepwalking. Also admits to drinking alcohol tonight. Does not remember what happened. His wife states he fell twice while  in the bathroom striking his head or losing consciousness. He also had one episode of vomiting.  Wife states he fell first and then may have lost consciousness after he hit his head. No seizure activity.  Patient is alert and oriented 3. He denies chest pain or shortness of breath. Complains of headache only.  CT scan is negative for intracranial hemorrhage or skull fracture. Tetanus up-to-date. Wound repaired as above. EKG shows normal sinus rhythm. No Brugada. No prolonged QT.  [place] syncope rules negative.  Patient tolerating PO and ambulatory.  Denies dizziness. Orthostatics negative.  Suspect fall due to intoxication and possible sleep walking.  Doubt cardiogenic syncope.   Follow up with PCP for staple removal as well as further evaluation for sleepwalking episodes. Return precautions discussed.  Final Clinical Impressions(s) / ED Diagnoses   Final diagnoses:  Fall, initial encounter  Minor head injury, initial encounter    New Prescriptions New Prescriptions   No medications on file  I personally performed the services described in this documentation, which was scribed in my presence. The recorded information has been reviewed and is accurate.    Glynn Octave, MD 10/07/16 [phone]

## 2016-10-07 NOTE — ED Notes (Signed)
Pt given iced water upon request.

## 2017-10-06 ENCOUNTER — Other Ambulatory Visit: Payer: Self-pay

## 2017-10-06 ENCOUNTER — Emergency Department (HOSPITAL_COMMUNITY)
Admission: EM | Admit: 2017-10-06 | Discharge: 2017-10-07 | Disposition: A | Discharge status: Home / Self Care | Payer: BLUE CROSS/BLUE SHIELD | Attending: Emergency Medicine | Admitting: Emergency Medicine

## 2017-10-06 DIAGNOSIS — R0602 Shortness of breath: Secondary | ICD-10-CM | POA: Insufficient documentation

## 2017-10-06 DIAGNOSIS — Z5321 Procedure and treatment not carried out due to patient leaving prior to being seen by health care provider: Secondary | ICD-10-CM | POA: Insufficient documentation

## 2017-10-06 DIAGNOSIS — R21 Rash and other nonspecific skin eruption: Secondary | ICD-10-CM | POA: Insufficient documentation

## 2017-10-06 NOTE — ED Triage Notes (Addendum)
Pt reports feeling sob around 9pm and broke out in a rash. Pt has a cough that started tonight after he began feeling sob.Pt has also not felt well in the last week, chills, and hot spells. Pt states he just feels hot. Rash looks like it is beginning to clear up but pt is still coughing and sob. Pt has not eaten anything new or used new soaps.

## 2017-10-06 NOTE — ED Notes (Signed)
No answer in waiting room X2,  

## 2017-10-07 NOTE — ED Notes (Signed)
No answer in waiting room X3,  

## 2019-06-08 ENCOUNTER — Encounter: Payer: Self-pay | Admitting: Orthopedic Surgery

## 2019-06-08 ENCOUNTER — Ambulatory Visit: Payer: BC Managed Care – PPO | Admitting: Orthopedic Surgery

## 2019-06-15 ENCOUNTER — Ambulatory Visit: Payer: BC Managed Care – PPO

## 2019-06-15 ENCOUNTER — Ambulatory Visit: Payer: BC Managed Care – PPO | Admitting: Orthopedic Surgery

## 2019-06-15 ENCOUNTER — Other Ambulatory Visit: Payer: Self-pay

## 2019-06-15 ENCOUNTER — Encounter: Payer: Self-pay | Admitting: Orthopedic Surgery

## 2019-06-15 VITALS — BP 145/89 | HR 82 | Ht 71.0 in | Wt 178.0 lb

## 2019-06-15 DIAGNOSIS — G8929 Other chronic pain: Secondary | ICD-10-CM | POA: Diagnosis not present

## 2019-06-15 DIAGNOSIS — M25561 Pain in right knee: Secondary | ICD-10-CM

## 2019-06-15 DIAGNOSIS — M25461 Effusion, right knee: Secondary | ICD-10-CM

## 2019-06-15 DIAGNOSIS — M23321 Other meniscus derangements, posterior horn of medial meniscus, right knee: Secondary | ICD-10-CM | POA: Diagnosis not present

## 2019-06-15 MED ORDER — MELOXICAM 7.5 MG PO TABS: 7.5000 mg | ORAL_TABLET | Freq: Every day | ORAL | 5 refills | Status: DC

## 2019-06-15 NOTE — Patient Instructions (Signed)
Start meloxicam 1 a day   Ice 30 min nightly

## 2019-06-15 NOTE — Progress Notes (Signed)
Luis Chang  06/15/2019  Body mass index is 24.83 kg/m.   HISTORY SECTION :  Chief Complaint  Patient presents with  . 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  He reports mild to moderate pain swelling decreased range of motion and heat or warmth in the joint    Review of Systems  Neurological: Positive for tingling.  All other systems reviewed and are negative.    has a past medical history of HTN (hypertension) and Sleep walking.   Past Surgical History:  Procedure Laterality Date  . FOREIGN BODY REMOVAL  2007   RIGHT HAND  . WISDOM TOOTH EXTRACTION      Body mass index is 24.83 kg/m.   No Known Allergies   Current Outpatient Medications:  .  meloxicam (MOBIC) 7.5 MG tablet, Take 1 tablet (7.5 mg total) by mouth daily., Disp: 30 tablet, Rfl: 5   PHYSICAL EXAM SECTION: 1) BP (!) 145/89   Pulse 82   Ht 5\' 11"  (1.803 m)   Wt 178 lb (80.7 kg)   BMI 24.83 kg/m   Body mass index is 24.83 kg/m. General appearance: Well-developed well-nourished no gross deformities  2) Cardiovascular normal pulse and perfusion in the lower extremities normal color without edema  3) Neurologically deep tendon reflexes are equal and normal, no sensation loss or deficits no pathologic reflexes  4) Psychological: Awake alert and oriented x3 mood and affect normal  5) Skin no lacerations or ulcerations no nodularity no palpable masses, no erythema or nodularity  6) Musculoskeletal: Left knee nontender no effusion normal range of motion ligaments feel stable muscle tone is normal  Right knee small effusion tenderness medial femoral condyle medial joint line external rotation valgus stress McMurray sign positive strength and muscle tone normal ligaments are stable   MEDICAL DECISION SECTION:  Encounter Diagnoses  Name Primary?  . Chronic pain of right knee   .  Effusion, right knee Yes  . Derangement of posterior horn of medial meniscus of right knee     Imaging X-rays show normal alignment and normal joint spaces  Plan:  (Rx., Inj., surg., Frx, MRI/CT, XR:2) Aspiration injection right knee Meloxicam Return in 4 weeks   Procedure note injection and aspiration right knee joint  Verbal consent was obtained to aspirate and inject the right knee joint   Timeout was completed to confirm the site of aspiration and injection  An 18-gauge needle was used to aspirate the knee joint from a suprapatellar lateral approach.  The medications used were 40 mg of Depo-Medrol and 1% lidocaine 3 cc  Anesthesia was provided by ethyl chloride and the skin was prepped with alcohol.  After cleaning the skin with alcohol an 18-gauge needle was used to aspirate the right knee joint.  We obtained 10  cc of fluid clear yellow   We follow this by injection of 40 mg of Depo-Medrol and 3 cc 1% lidocaine.  There were no complications. A sterile bandage was applied.      9:30 AM Arther Abbott, MD  06/15/2019

## 2019-07-13 ENCOUNTER — Ambulatory Visit: Payer: BC Managed Care – PPO | Admitting: Orthopedic Surgery

## 2019-11-27 ENCOUNTER — Other Ambulatory Visit: Payer: Self-pay

## 2019-11-27 ENCOUNTER — Inpatient Hospital Stay (HOSPITAL_COMMUNITY)
Admission: EM | Admit: 2019-11-27 | Discharge: 2019-11-30 | DRG: 287 | Disposition: A | Discharge status: Home / Self Care | Payer: BC Managed Care – PPO | Attending: Internal Medicine | Admitting: Internal Medicine

## 2019-11-27 ENCOUNTER — Encounter (HOSPITAL_COMMUNITY): Payer: Self-pay

## 2019-11-27 ENCOUNTER — Emergency Department (HOSPITAL_COMMUNITY): Payer: BC Managed Care – PPO

## 2019-11-27 DIAGNOSIS — R61 Generalized hyperhidrosis: Secondary | ICD-10-CM | POA: Diagnosis present

## 2019-11-27 DIAGNOSIS — I351 Nonrheumatic aortic (valve) insufficiency: Secondary | ICD-10-CM | POA: Diagnosis not present

## 2019-11-27 DIAGNOSIS — Z20822 Contact with and (suspected) exposure to covid-19: Secondary | ICD-10-CM | POA: Diagnosis present

## 2019-11-27 DIAGNOSIS — Z8261 Family history of arthritis: Secondary | ICD-10-CM | POA: Diagnosis not present

## 2019-11-27 DIAGNOSIS — R778 Other specified abnormalities of plasma proteins: Secondary | ICD-10-CM | POA: Diagnosis not present

## 2019-11-27 DIAGNOSIS — I25110 Atherosclerotic heart disease of native coronary artery with unstable angina pectoris: Principal | ICD-10-CM | POA: Diagnosis present

## 2019-11-27 DIAGNOSIS — F513 Sleepwalking [somnambulism]: Secondary | ICD-10-CM | POA: Diagnosis present

## 2019-11-27 DIAGNOSIS — I249 Acute ischemic heart disease, unspecified: Secondary | ICD-10-CM | POA: Diagnosis not present

## 2019-11-27 DIAGNOSIS — R7989 Other specified abnormal findings of blood chemistry: Secondary | ICD-10-CM | POA: Diagnosis not present

## 2019-11-27 DIAGNOSIS — R079 Chest pain, unspecified: Secondary | ICD-10-CM | POA: Diagnosis not present

## 2019-11-27 DIAGNOSIS — I200 Unstable angina: Secondary | ICD-10-CM | POA: Diagnosis not present

## 2019-11-27 DIAGNOSIS — Z8249 Family history of ischemic heart disease and other diseases of the circulatory system: Secondary | ICD-10-CM

## 2019-11-27 DIAGNOSIS — I10 Essential (primary) hypertension: Secondary | ICD-10-CM | POA: Diagnosis not present

## 2019-11-27 DIAGNOSIS — I361 Nonrheumatic tricuspid (valve) insufficiency: Secondary | ICD-10-CM | POA: Diagnosis not present

## 2019-11-27 DIAGNOSIS — E785 Hyperlipidemia, unspecified: Secondary | ICD-10-CM | POA: Diagnosis not present

## 2019-11-27 DIAGNOSIS — F17213 Nicotine dependence, cigarettes, with withdrawal: Secondary | ICD-10-CM | POA: Diagnosis not present

## 2019-11-27 LAB — BASIC METABOLIC PANEL
Anion gap: 11 (ref 5–15)
BUN: 12 mg/dL (ref 6–20)
CO2: 25 mmol/L (ref 22–32)
Calcium: 9.2 mg/dL (ref 8.9–10.3)
Chloride: 102 mmol/L (ref 98–111)
Creatinine, Ser: 1 mg/dL (ref 0.61–1.24)
GFR calc Af Amer: >60 mL/min (ref >60)
GFR calc non Af Amer: >60 mL/min (ref >60)
Glucose, Bld: 102 mg/dL — ABNORMAL HIGH (ref 70–99)
Potassium: 4.4 mmol/L (ref 3.5–5.1)
Sodium: 138 mmol/L (ref 135–145)

## 2019-11-27 LAB — CREATININE, SERUM
Creatinine, Ser: 1 mg/dL (ref 0.61–1.24)
GFR calc Af Amer: >60 mL/min (ref >60)
GFR calc non Af Amer: >60 mL/min (ref >60)

## 2019-11-27 LAB — CBC
HCT: 49.6 % (ref 39.0–52.0)
HCT: 49.5 % (ref 39.0–52.0)
Hemoglobin: 17.1 g/dL — ABNORMAL HIGH (ref 13.0–17.0)
Hemoglobin: 17.3 g/dL — ABNORMAL HIGH (ref 13.0–17.0)
MCH: 32.3 pg (ref 26.0–34.0)
MCH: 32.6 pg (ref 26.0–34.0)
MCHC: 34.5 g/dL (ref 30.0–36.0)
MCHC: 34.9 g/dL (ref 30.0–36.0)
MCV: 93.6 fL (ref 80.0–100.0)
MCV: 93.4 fL (ref 80.0–100.0)
Platelets: 204 10*3/uL (ref 150–400)
Platelets: 226 10*3/uL (ref 150–400)
RBC: 5.3 MIL/uL (ref 4.22–5.81)
RBC: 5.3 MIL/uL (ref 4.22–5.81)
RDW: 12.3 % (ref 11.5–15.5)
RDW: 12.1 % (ref 11.5–15.5)
WBC: 8.6 10*3/uL (ref 4.0–10.5)
WBC: 11.2 10*3/uL — ABNORMAL HIGH (ref 4.0–10.5)
nRBC: 0 % (ref 0.0–0.2)
nRBC: 0 % (ref 0.0–0.2)

## 2019-11-27 LAB — LIPID PANEL
Cholesterol: 194 mg/dL (ref 0–200)
HDL: 53 mg/dL (ref >40)
LDL Cholesterol: 129 mg/dL — ABNORMAL HIGH (ref 0–99)
Total CHOL/HDL Ratio: 3.7 RATIO
Triglycerides: 58 mg/dL (ref <150)
VLDL: 12 mg/dL (ref 0–40)

## 2019-11-27 LAB — HEPARIN LEVEL (UNFRACTIONATED): Heparin Unfractionated: 0.38 IU/mL (ref 0.30–0.70)

## 2019-11-27 LAB — SARS CORONAVIRUS 2 BY RT PCR (HOSPITAL ORDER, PERFORMED IN ~~LOC~~ HOSPITAL LAB): SARS Coronavirus 2: NEGATIVE

## 2019-11-27 LAB — TROPONIN I (HIGH SENSITIVITY)
Troponin I (High Sensitivity): 3 ng/L (ref <18)
Troponin I (High Sensitivity): 18 ng/L — ABNORMAL HIGH (ref <18)
Troponin I (High Sensitivity): 38 ng/L — ABNORMAL HIGH (ref <18)

## 2019-11-27 LAB — HIV ANTIBODY (ROUTINE TESTING W REFLEX): HIV Screen 4th Generation wRfx: NONREACTIVE

## 2019-11-27 MED ORDER — ASPIRIN 81 MG PO CHEW
324.0000 mg | CHEWABLE_TABLET | Freq: Once | ORAL | Status: AC
  Administered 2019-11-27: 324 mg via ORAL
  Filled 2019-11-27: qty 4

## 2019-11-27 MED ORDER — ONDANSETRON HCL 4 MG/2ML IJ SOLN: 4.0000 mg | Freq: Four times a day (QID) | INTRAMUSCULAR | Status: DC | PRN

## 2019-11-27 MED ORDER — METOPROLOL TARTRATE 25 MG PO TABS
25.0000 mg | ORAL_TABLET | Freq: Two times a day (BID) | ORAL | Status: DC
  Administered 2019-11-27 – 2019-11-30 (×7): 25 mg via ORAL
  Filled 2019-11-27 (×8): qty 1

## 2019-11-27 MED ORDER — ASPIRIN 81 MG PO CHEW
324.0000 mg | CHEWABLE_TABLET | ORAL | Status: AC
  Administered 2019-11-27: 324 mg via ORAL
  Filled 2019-11-27: qty 4

## 2019-11-27 MED ORDER — NICOTINE 21 MG/24HR TD PT24
21.0000 mg | MEDICATED_PATCH | Freq: Once | TRANSDERMAL | Status: AC
  Administered 2019-11-27: 21 mg via TRANSDERMAL
  Filled 2019-11-27: qty 1

## 2019-11-27 MED ORDER — HEPARIN (PORCINE) 25000 UT/250ML-% IV SOLN
1200.0000 [IU]/h | INTRAVENOUS | Status: DC
  Administered 2019-11-27: 1100 [IU]/h via INTRAVENOUS
  Filled 2019-11-27 (×4): qty 250

## 2019-11-27 MED ORDER — HEPARIN SODIUM (PORCINE) 5000 UNIT/ML IJ SOLN: 5000.0000 [IU] | Freq: Three times a day (TID) | INTRAMUSCULAR | Status: DC

## 2019-11-27 MED ORDER — NITROGLYCERIN 0.4 MG SL SUBL: 0.4000 mg | SUBLINGUAL_TABLET | SUBLINGUAL | Status: DC | PRN

## 2019-11-27 MED ORDER — METOPROLOL TARTRATE 5 MG/5ML IV SOLN
5.0000 mg | Freq: Once | INTRAVENOUS | Status: AC
  Administered 2019-11-27: 5 mg via INTRAVENOUS
  Filled 2019-11-27: qty 5

## 2019-11-27 MED ORDER — ASPIRIN 300 MG RE SUPP
300.0000 mg | RECTAL | Status: AC
  Filled 2019-11-27: qty 1

## 2019-11-27 MED ORDER — ASPIRIN EC 81 MG PO TBEC
81.0000 mg | DELAYED_RELEASE_TABLET | Freq: Every day | ORAL | Status: DC
  Administered 2019-11-28 – 2019-11-29 (×2): 81 mg via ORAL
  Filled 2019-11-27 (×2): qty 1

## 2019-11-27 MED ORDER — METOPROLOL TARTRATE 5 MG/5ML IV SOLN: 5.0000 mg | Freq: Four times a day (QID) | INTRAVENOUS | Status: DC | PRN

## 2019-11-27 MED ORDER — HEPARIN BOLUS VIA INFUSION
4000.0000 [IU] | Freq: Once | INTRAVENOUS | Status: AC
  Administered 2019-11-27: 4000 [IU] via INTRAVENOUS

## 2019-11-27 MED ORDER — ACETAMINOPHEN 325 MG PO TABS
650.0000 mg | ORAL_TABLET | ORAL | Status: DC | PRN
  Administered 2019-11-29: 650 mg via ORAL
  Filled 2019-11-27: qty 2

## 2019-11-27 NOTE — Progress Notes (Signed)
ANTICOAGULATION CONSULT NOTE   Pharmacy Consult for heparin dosing Indication: ACS/STEMI  No Known Allergies  Patient Measurements: Height: 5\' 11"  (180.3 cm) Weight: 82.2 kg (181 lb 4.8 oz) IBW/kg (Calculated) : 75.3 Heparin Dosing Weight: HEPARIN DW (KG): 82.2   Vital Signs: Temp: 98.3 F (36.8 C) (06/04 2103) Temp Source: Oral (06/04 2103) BP: 150/97 (06/04 2103) Pulse Rate: 71 (06/04 2103)  Labs: Recent Labs    11/27/19 0939 11/27/19 1141 11/27/19 1332 11/27/19 2123 11/27/19 2159  HGB 17.1*  --   --  17.3*  --   HCT 49.6  --   --  49.5  --   PLT 204  --   --  226  --   HEPARINUNFRC  --   --   --   --  0.38  CREATININE 1.00  --   --  1.00  --   TROPONINIHS 3 18* 38*  --   --     Estimated Creatinine Clearance: 99.4 mL/min (by C-G formula based on SCr of 1 mg/dL).   Medical History: Past Medical History:  Diagnosis Date  . HTN (hypertension)   . Sleep walking     Assessment: Pharmacy consulted to dose heparin infusion for this 44 yo male  wth chest pain and elevated  troponins. Baseline CBC is WNL. Patient wasn't taking any anti-coagulant medications prior to admission.   Initial heparin level therapeutic at 0.38.  Goal of Therapy:  Heparin level 0.3-0.7 units/ml Monitor platelets by anticoagulation protocol: Yes   Plan:  Continue heparin 1100 units/h Daily heparin level and CBC   52, PharmD, BCPS Clinical Pharmacist [phone] Please check AMION for all [facility] numbers 11/27/2019

## 2019-11-27 NOTE — Progress Notes (Signed)
ANTICOAGULATION CONSULT NOTE - Initial Consult  Pharmacy Consult for heparin dosing Indication: ACS/STEMI  No Known Allergies  Patient Measurements: Height: 5\' 11"  (180.3 cm) Weight: 81.6 kg (180 lb) IBW/kg (Calculated) : 75.3 Heparin Dosing Weight: HEPARIN DW (KG): 81.6   Vital Signs: BP: 180/91 (06/04 1430) Pulse Rate: 83 (06/04 1430)  Labs: Recent Labs    11/27/19 0939 11/27/19 1141 11/27/19 1332  HGB 17.1*  --   --   HCT 49.6  --   --   PLT 204  --   --   CREATININE 1.00  --   --   TROPONINIHS 3 18* 38*    Estimated Creatinine Clearance: 99.4 mL/min (by C-G formula based on SCr of 1 mg/dL).   Medical History: Past Medical History:  Diagnosis Date  . HTN (hypertension)   . Sleep walking     Assessment: Pharmacy consulted to dose heparin infusion for this 44 yo male  wth chest pain and elevated  troponins. Baseline CBC is WNL. Patient wasn't taking any anti-coagulant medications prior to admission.   Goal of Therapy:  Heparin level 0.3-0.7 units/ml Monitor platelets by anticoagulation protocol: Yes   Plan:  Give 4000 units bolus x 1 Start heparin infusion at 1100 units/hr Check anti-Xa level in 6-8 hours and daily while on heparin Continue to monitor H&H and platelets  54 11/27/2019,2:53 PM

## 2019-11-27 NOTE — ED Provider Notes (Addendum)
[facility] EMERGENCY DEPARTMENT Provider Note   CSN: [id number] Arrival date & time: 11/27/19  9326     History Chief Complaint  Patient presents with  . Chest Pain    DAVARION CUFFEE is a 44 y.o. male.  Patient c/o mid chest pain this AM. Symptoms acute onset, at rest, moderate, radiated towards neck and right shoulder, dull, episodic. Symptoms lasted 15-20 minutes, now resolved. Notes similar episode ~ 1-2 months ago, but did not get checked then. Pain was not pleuritic, and has resolved. +felt sweaty and mildly nauseated. No vomiting. + sob. No cough or uri symptoms. No fever or chills. Denies personal or fam hx cad. +smoker. No chest wall injury or strain. No heartburn. No leg pain or swelling. No hx dvt or pe.   The history is provided by the patient.  Chest Pain Associated symptoms: no abdominal pain, no back pain, no cough, no fever, no headache, no palpitations, no shortness of breath and no vomiting        Past Medical History:  Diagnosis Date  . HTN (hypertension)   . Sleep walking     Patient Active Problem List   Diagnosis Date Noted  . Distal radial fracture 02/13/2012  . FRACTURE, HAND 05/08/2010  . CLOSED FRACTURE OF SHAFT OF METACARPAL BONE 05/08/2010  . CLOS FRACTURE MID/PROXIMAL PHALANX/PHALANG HAND 05/08/2010  . FRACTURE, TIBIAL PLATEAU 05/08/2010    Past Surgical History:  Procedure Laterality Date  . FOREIGN BODY REMOVAL  2007   RIGHT HAND  . WISDOM TOOTH EXTRACTION         Family History  Problem Relation Age of Onset  . Arthritis Other   . High blood pressure Father   . High blood pressure Sister     Social History   Tobacco Use  . Smoking status: Current Every Day Smoker    Packs/day: 2.00    Types: Cigarettes  . Smokeless tobacco: Never Used  Substance Use Topics  . Alcohol use: Yes    Comment: OCCASIONALLY  . Drug use: No    Home Medications Prior to Admission medications   Medication Sig Start Date End Date Taking?  Authorizing Provider  meloxicam (MOBIC) 7.5 MG tablet Take 1 tablet (7.5 mg total) by mouth daily. 06/15/19   Carole Civil, MD    Allergies    Patient has no known allergies.  Review of Systems   Review of Systems  Constitutional: Negative for fever.  HENT: Negative for sore throat.   Eyes: Negative for redness.  Respiratory: Negative for cough and shortness of breath.   Cardiovascular: Positive for chest pain. Negative for palpitations and leg swelling.  Gastrointestinal: Negative for abdominal pain and vomiting.  Genitourinary: Negative for flank pain.  Musculoskeletal: Negative for back pain and neck pain.  Skin: Negative for rash.  Neurological: Negative for headaches.  Hematological: Does not bruise/bleed easily.  Psychiatric/Behavioral: Negative for confusion.    Physical Exam Updated Vital Signs BP (!) 164/107 (BP Location: Left Arm)   Pulse 75   Resp 16   Ht 1.803 m (5\' 11" )   Wt 81.6 kg   SpO2 99%   BMI 25.10 kg/m   Physical Exam Vitals and nursing note reviewed.  Constitutional:      Appearance: Normal appearance. He is well-developed.  HENT:     Head: Atraumatic.     Nose: Nose normal.     Mouth/Throat:     Mouth: Mucous membranes are moist.     Pharynx: Oropharynx  is clear.  Eyes:     General: No scleral icterus.    Conjunctiva/sclera: Conjunctivae normal.  Neck:     Trachea: No tracheal deviation.  Cardiovascular:     Rate and Rhythm: Normal rate and regular rhythm.     Pulses: Normal pulses.     Heart sounds: Normal heart sounds. No murmur. No friction rub. No gallop.   Pulmonary:     Effort: Pulmonary effort is normal. No accessory muscle usage or respiratory distress.     Breath sounds: Normal breath sounds.  Chest:     Chest wall: No tenderness.  Abdominal:     General: Bowel sounds are normal. There is no distension.     Palpations: Abdomen is soft.     Tenderness: There is no abdominal tenderness. There is no guarding.    Genitourinary:    Comments: No cva tenderness. Musculoskeletal:        General: No swelling or tenderness.     Cervical back: Normal range of motion and neck supple. No rigidity.     Right lower leg: No edema.     Left lower leg: No edema.  Skin:    General: Skin is warm and dry.     Findings: No rash.  Neurological:     Mental Status: He is alert.     Comments: Alert, speech clear.   Psychiatric:        Mood and Affect: Mood normal.     ED Results / Procedures / Treatments   Labs (all labs ordered are listed, but only abnormal results are displayed) Results for orders placed or performed during the hospital encounter of 11/27/19  Basic metabolic panel  Result Value Ref Range   Sodium 138 135 - 145 mmol/L   Potassium 4.4 3.5 - 5.1 mmol/L   Chloride 102 98 - 111 mmol/L   CO2 25 22 - 32 mmol/L   Glucose, Bld 102 (H) 70 - 99 mg/dL   BUN 12 6 - 20 mg/dL   Creatinine, Ser 3.87 0.61 - 1.24 mg/dL   Calcium 9.2 8.9 - 56.4 mg/dL   GFR calc non Af Amer >60 >60 mL/min   GFR calc Af Amer >60 >60 mL/min   Anion gap 11 5 - 15  CBC  Result Value Ref Range   WBC 8.6 4.0 - 10.5 K/uL   RBC 5.30 4.22 - 5.81 MIL/uL   Hemoglobin 17.1 (H) 13.0 - 17.0 g/dL   HCT 33.2 95.1 - 88.4 %   MCV 93.6 80.0 - 100.0 fL   MCH 32.3 26.0 - 34.0 pg   MCHC 34.5 30.0 - 36.0 g/dL   RDW 16.6 06.3 - 01.6 %   Platelets 204 150 - 400 K/uL   nRBC 0.0 0.0 - 0.2 %  Lipid panel  Result Value Ref Range   Cholesterol 194 0 - 200 mg/dL   Triglycerides 58 <010 mg/dL   HDL 53 >93 mg/dL   Total CHOL/HDL Ratio 3.7 RATIO   VLDL 12 0 - 40 mg/dL   LDL Cholesterol 235 (H) 0 - 99 mg/dL  Troponin I (High Sensitivity)  Result Value Ref Range   Troponin I (High Sensitivity) 3 <18 ng/L  Troponin I (High Sensitivity)  Result Value Ref Range   Troponin I (High Sensitivity) 18 (H) <18 ng/L  Troponin I (High Sensitivity)  Result Value Ref Range   Troponin I (High Sensitivity) 38 (H) <18 ng/L    EKG EKG  Interpretation  Date/Time:  Friday November 27 2019 09:23:41 EDT Ventricular Rate:  76 PR Interval:  156 QRS Duration: 80 QT Interval:  392 QTC Calculation: 441 R Axis:   78 Text Interpretation: Normal sinus rhythm Nonspecific ST abnormality No significant change since last tracing Confirmed by Cathren Laine ([id number]) on 11/27/2019 9:39:34 AM   Radiology DG Chest 2 View  Result Date: 11/27/2019 CLINICAL DATA:  Chest pain, diaphoresis, shoulder pain 30 minutes ago EXAM: CHEST - 2 VIEW COMPARISON:  10/07/2016 FINDINGS: The heart size and mediastinal contours are within normal limits. Both lungs are clear. The visualized skeletal structures are unremarkable. IMPRESSION: No active cardiopulmonary disease. Electronically Signed   By: Elige Ko   On: 11/27/2019 10:47    Procedures Procedures (including critical care time)  Medications Ordered in ED Medications - No data to display  ED Course  I have reviewed the triage vital signs and the nursing notes.  Pertinent labs & imaging results that were available during my care of the patient were reviewed by me and considered in my medical decision making (see chart for details).    MDM Rules/Calculators/A&P                      Iv ns. Stat labs. Ecg. Cxr. ASA.  MDM Number of Diagnoses or Management Options   Amount and/or Complexity of Data Reviewed Clinical lab tests: ordered and reviewed Tests in the medicine section of CPT: ordered and reviewed Discussion of test results with the performing providers: yes Decide to obtain previous medical records or to obtain history from someone other than the patient: yes Obtain history from someone other than the patient: yes Review and summarize past medical records: yes Discuss the patient with other providers: yes Independent visualization of images, tracings, or specimens: yes  Risk of Complications, Morbidity, and/or Mortality Presenting problems: high Diagnostic procedures:  high Management options: high   Reviewed nursing notes and prior charts for additional history.   Initial labs reviewed/interpreted by me - wbc normal. hgb 17.   cxr reviewed/interpreted by me - no pna.  Additional labs reviewed/interpreted by me - trop is normal.  Delta trop pending.   Recheck pt, no chest pain or discomfort, no sob. Pt asymptomatic.   Additional labs reviewed/interpreted by me - trop is 18, which is high than initial. Pts description of chest pain also concerning. Cardiology consulted.   Dr Tenny Craw has ordered bp therapy, and 3rd troponin, and indicates plans to admit.   Heparin iv.   CRITICAL CARE RE: acute coronary syndrome/unstable angina, elevated troponin Performed by: Suzi Roots Total critical care time: 40 minutes Critical care time was exclusive of separately billable procedures and treating other patients. Critical care was necessary to treat or prevent imminent or life-threatening deterioration. Critical care was time spent personally by me on the following activities: development of treatment plan with patient and/or surrogate as well as nursing, discussions with consultants, evaluation of patient's response to treatment, examination of patient, obtaining history from patient or surrogate, ordering and performing treatments and interventions, ordering and review of laboratory studies, ordering and review of radiographic studies, pulse oximetry and re-evaluation of patient's condition.  After lengthy discussion with patient/spouse, Dr Tenny Craw indicates admit to hospitalist at [facility], they will follow up advise on logistics of moving to [facility], possible cath in next few days.   Discussed with Dr Adrian Blackwater, hospitalist - he feels pt needs to be on cardiology service at [facility]. I facilitated conversation between he and Dr Tenny Craw -  he indicates  Dr Tenny Craw will admit/transfer to [facility].  Pt remains pain free. Pt updated on plan.     Final Clinical Impression(s) / ED  Diagnoses Final diagnoses:  None    Rx / DC Orders ED Discharge Orders    None          Cathren Laine, MD 11/27/19 [phone]

## 2019-11-27 NOTE — ED Notes (Signed)
ED TO INPATIENT HANDOFF REPORT  ED Nurse Name and Phone #:   S Name/Age/Gender Luis Chang 44 y.o. male Room/Bed: APA15/APA15  Code Status   Code Status: Not on file  Home/SNF/Other Home Patient oriented to: self, place, time and situation Is this baseline? Yes   Triage Complete: Triage complete  Chief Complaint CHEST PAIN  Triage Note Pt began having central chest pain, diaphoresis, and shoulder pain 30 minutes ago. This happened to patient 1 month ago. Describes pain as throbbing.     Allergies No Known Allergies  Level of Care/Admitting Diagnosis ED Disposition    ED Disposition Condition Comment   Admit  The patient appears reasonably stabilized for admission considering the current resources, flow, and capabilities available in the ED at this time, and I doubt any other [facility] requiring further screening and/or treatment in the ED prior to admission is  present.       B Medical/Surgery History Past Medical History:  Diagnosis Date  . HTN (hypertension)   . Sleep walking    Past Surgical History:  Procedure Laterality Date  . FOREIGN BODY REMOVAL  2007   RIGHT HAND  . WISDOM TOOTH EXTRACTION       A IV Location/Drains/Wounds Patient Lines/Drains/Airways Status   Active Line/Drains/Airways    Name:   Placement date:   Placement time:   Site:   Days:   Peripheral IV 11/27/19 Right Antecubital   11/27/19    0942    Antecubital   less than 1          Intake/Output Last 24 hours No intake or output data in the 24 hours ending 11/27/19 1952  Labs/Imaging Results for orders placed or performed during the hospital encounter of 11/27/19 (from the past 48 hour(s))  Basic metabolic panel     Status: Abnormal   Collection Time: 11/27/19  9:39 AM  Result Value Ref Range   Sodium 138 135 - 145 mmol/L   Potassium 4.4 3.5 - 5.1 mmol/L   Chloride 102 98 - 111 mmol/L   CO2 25 22 - 32 mmol/L   Glucose, Bld 102 (H) 70 - 99 mg/dL    Comment: Glucose reference  range applies only to samples taken after fasting for at least 8 hours.   BUN 12 6 - 20 mg/dL   Creatinine, Ser 1.93 0.61 - 1.24 mg/dL   Calcium 9.2 8.9 - 79.0 mg/dL   GFR calc non Af Amer >60 >60 mL/min   GFR calc Af Amer >60 >60 mL/min   Anion gap 11 5 - 15    Comment: Performed at [facility], [address], [place], [place] [address]  CBC     Status: Abnormal   Collection Time: 11/27/19  9:39 AM  Result Value Ref Range   WBC 8.6 4.0 - 10.5 K/uL   RBC 5.30 4.22 - 5.81 MIL/uL   Hemoglobin 17.1 (H) 13.0 - 17.0 g/dL   HCT 35.3 29.9 - 24.2 %   MCV 93.6 80.0 - 100.0 fL   MCH 32.3 26.0 - 34.0 pg   MCHC 34.5 30.0 - 36.0 g/dL   RDW 68.3 41.9 - 62.2 %   Platelets 204 150 - 400 K/uL   nRBC 0.0 0.0 - 0.2 %    Comment: Performed at [facility], [address] [place], [place] [address]  Troponin I (High Sensitivity)     Status: None   Collection Time: 11/27/19  9:39 AM  Result Value Ref Range  Troponin I (High Sensitivity) 3 <18 ng/L    Comment: (NOTE) Elevated high sensitivity troponin I (hsTnI) values and significant  changes across serial measurements may suggest ACS but many other  chronic and acute conditions are known to elevate hsTnI results.  Refer to the "Links" section for chest pain algorithms and additional  guidance. Performed at [facility], [address], [place], [place] [address]   Troponin I (High Sensitivity)     Status: Abnormal   Collection Time: 11/27/19 11:41 AM  Result Value Ref Range   Troponin I (High Sensitivity) 18 (H) <18 ng/L    Comment: (NOTE) Elevated high sensitivity troponin I (hsTnI) values and significant  changes across serial measurements may suggest ACS but many other  chronic and acute conditions are known to elevate hsTnI results.  Refer to the "Links" section for chest pain algorithms and additional  guidance. Performed at [facility], [address], [place], [place] [address]   Troponin I (High Sensitivity)     Status: Abnormal    Collection Time: 11/27/19  1:32 PM  Result Value Ref Range   Troponin I (High Sensitivity) 38 (H) <18 ng/L    Comment: DELTA CHECK NOTED (NOTE) Elevated high sensitivity troponin I (hsTnI) values and significant  changes across serial measurements may suggest ACS but many other  chronic and acute conditions are known to elevate hsTnI results.  Refer to the Links section for chest pain algorithms and additional  guidance. Performed at [facility], [address] [place], [place] [address]   Lipid panel     Status: Abnormal   Collection Time: 11/27/19  1:32 PM  Result Value Ref Range   Cholesterol 194 0 - 200 mg/dL   Triglycerides 58 <212 mg/dL   HDL 53 >24 mg/dL   Total CHOL/HDL Ratio 3.7 RATIO   VLDL 12 0 - 40 mg/dL   LDL Cholesterol 825 (H) 0 - 99 mg/dL    Comment:        Total Cholesterol/HDL:CHD Risk Coronary Heart Disease Risk Table                     Men   Women  1/2 Average Risk   3.4   3.3  Average Risk       5.0   4.4  2 X Average Risk   9.6   7.1  3 X Average Risk  23.4   11.0        Use the calculated Patient Ratio above and the CHD Risk Table to determine the patient's CHD Risk.        ATP III CLASSIFICATION (LDL):  <100     mg/dL   Optimal  003-704  mg/dL   Near or Above                    Optimal  130-159  mg/dL   Borderline  888-916  mg/dL   High  >945     mg/dL   Very High Performed at [facility], [address], [place], [place] [address]   SARS Coronavirus 2 by RT PCR (hospital order, performed in [facility] hospital lab) Nasopharyngeal Nasopharyngeal Swab     Status: None   Collection Time: 11/27/19  6:02 PM   Specimen: Nasopharyngeal Swab  Result Value Ref Range   SARS Coronavirus 2 NEGATIVE NEGATIVE    Comment: (NOTE) SARS-CoV-2 target nucleic acids are NOT DETECTED. The SARS-CoV-2 RNA is generally detectable in upper and lower  respiratory specimens during the acute phase of infection. The lowest concentration of SARS-CoV-2 viral copies this  assay can detect is 250 copies / mL. A negative result does not preclude SARS-CoV-2 infection and should not be used as the sole basis for treatment or other patient management decisions.  A negative result may occur with improper specimen collection / handling, submission of specimen other than nasopharyngeal swab, presence of viral mutation(s) within the areas targeted by this assay, and inadequate number of viral copies (<250 copies / mL). A negative result must be combined with clinical observations, patient history, and epidemiological information. Fact Sheet for Patients:   BoilerBrush.com.cy Fact Sheet for Healthcare Providers: https://pope.com/ This test is not yet approved or cleared  by the [place] FDA and has been authorized for detection and/or diagnosis of SARS-CoV-2 by FDA under an Emergency Use Authorization (EUA).  This EUA will remain in effect (meaning this test can be used) for the duration of the COVID-19 declaration under Section 564(b)(1) of the Act, 21 U.S.C. section 360bbb-3(b)(1), unless the authorization is terminated or revoked sooner. Performed at [facility], [address] [place], [place] [address]    DG Chest 2 View  Result Date: 11/27/2019 CLINICAL DATA:  Chest pain, diaphoresis, shoulder pain 30 minutes ago EXAM: CHEST - 2 VIEW COMPARISON:  10/07/2016 FINDINGS: The heart size and mediastinal contours are within normal limits. Both lungs are clear. The visualized skeletal structures are unremarkable. IMPRESSION: No active cardiopulmonary disease. Electronically Signed   By: Elige Ko   On: 11/27/2019 10:47    Pending Labs Unresulted Labs (From admission, onward)    Start     Ordered   11/27/19 2100  Heparin level (unfractionated)  Once-Timed,   STAT    Comments: Page on call pharmacist if Heparin level is < 0.3 or > 0.7.  IF level is between these parameters then continue heparin at same rate.     11/27/19 1505   Signed and Held  HIV Antibody (routine testing w rflx)  (HIV Antibody (Routine testing w reflex) panel)  Once,   R     Signed and Held   Signed and Held  CBC  (heparin)  Once,   R    Comments: Baseline for heparin therapy IF NOT ALREADY DRAWN.  Notify MD if PLT < 100 K.    Signed and Held   Signed and Held  Creatinine, serum  (heparin)  Once,   R    Comments: Baseline for heparin therapy IF NOT ALREADY DRAWN.    Signed and Held   Signed and Held  Basic metabolic panel  Tomorrow morning,   R     Signed and Held   Signed and Held  CBC  Tomorrow morning,   R     Signed and Held          Vitals/Pain Today's Vitals   11/27/19 1600 11/27/19 1630 11/27/19 1700 11/27/19 1830  BP: (!) 169/95 (!) 159/101 (!) 147/95 (!) 154/100  Pulse: 65 60 73 68  Resp: (!) 8 12 17 14   SpO2: 99% 99% 94% 99%  Weight:      Height:      PainSc:        Isolation Precautions No active isolations  Medications Medications  metoprolol tartrate (LOPRESSOR) tablet 25 mg (25 mg Oral Given 11/27/19 1438)  heparin ADULT infusion 100 units/mL (25000 units/259mL sodium chloride 0.45%) (1,100 Units/hr Intravenous New Bag/Given 11/27/19 1607)  nicotine (NICODERM CQ - dosed in mg/24  hours) patch 21 mg (21 mg Transdermal Patch Applied 11/27/19 1829)  aspirin chewable tablet 324 mg (324 mg Oral Given 11/27/19 1309)  metoprolol tartrate (LOPRESSOR) injection 5 mg (5 mg Intravenous Given 11/27/19 1438)  heparin bolus via infusion 4,000 Units (4,000 Units Intravenous Bolus from Bag 11/27/19 1608)    Mobility walks Low fall risk   Focused Assessments Cardiac Assessment Handoff:    Lab Results  Component Value Date   TROPONINI <0.03 10/07/2016   No results found for: DDIMER Does the Patient currently have chest pain? No     R Recommendations: See Admitting Provider Note  Report given to:   Additional Notes:

## 2019-11-27 NOTE — ED Triage Notes (Signed)
Pt began having central chest pain, diaphoresis, and shoulder pain 30 minutes ago. This happened to patient 1 month ago. Describes pain as throbbing.

## 2019-11-27 NOTE — Consult Note (Addendum)
Cardiology Consultation:   Patient ID: Luis Chang MRN: [id number]; DOB: 02-Oct-1975  Admit date: 11/27/2019 Date of Consult: 11/27/2019  Primary Care Provider: [name], MD [facility] Cardiologist: New   Patient Profile:   Luis Chang is a 44 y.o. male with no prior cardiac Hx  who is being seen today for the evaluation of CP  at the request of Dr Denton Lank.  History of Present Illness:   Luis Chang is a 43 yo with a hx of HTN.   He presents to [facility] ED today with CP The pt woke up today feeling OK   Has been under increased stress lately   Got into truck and  experienced chest pressure (R parasternal, substernal)   Also pain in R arm  Became nauseated, diaphoretic     Spell lasted about 20 min and then resolved   Has had some similar spells in last couple weeks        He denies change in ability to do things   He's active   No SOB   Hs not had CP with activity   No GI problems Hx signif for 2 ppd smoking      Past Medical History:  Diagnosis Date  . HTN (hypertension)   . Sleep walking     Past Surgical History:  Procedure Laterality Date  . FOREIGN BODY REMOVAL  2007   RIGHT HAND  . WISDOM TOOTH EXTRACTION         Inpatient Medications: Scheduled Meds:  Continuous Infusions:  PRN Meds:   Allergies:   No Known Allergies  Social History:   Social History   Socioeconomic History  . Marital status: Married    Spouse name: Not on file  . Number of children: Not on file  . Years of education: 74  . Highest education level: Not on file  Occupational History    Employer: [employer]  Tobacco Use  . Smoking status: Current Every Day Smoker    Packs/day: 2.00    Types: Cigarettes  . Smokeless tobacco: Never Used  Substance and Sexual Activity  . Alcohol use: Yes    Comment: OCCASIONALLY  . Drug use: No  . Sexual activity: Not on file  Other Topics Concern  . Not on file  Social History Narrative  . Not on file   Social Determinants of  Health   Financial Resource Strain:   . Difficulty of Paying Living Expenses:   Food Insecurity:   . Worried About Programme researcher, broadcasting/film/video in the Last Year:   . Barista in the Last Year:   Transportation Needs:   . Freight forwarder (Medical):   Marland Kitchen Lack of Transportation (Non-Medical):   Physical Activity:   . Days of Exercise per Week:   . Minutes of Exercise per Session:   Stress:   . Feeling of Stress :   Social Connections:   . Frequency of Communication with Friends and Family:   . Frequency of Social Gatherings with Friends and Family:   . Attends Religious Services:   . Active Member of Clubs or Organizations:   . Attends Banker Meetings:   Marland Kitchen Marital Status:   Intimate Partner Violence:   . Fear of Current or Ex-Partner:   . Emotionally Abused:   Marland Kitchen Physically Abused:   . Sexually Abused:     Family History:   Hx of HTN   Family History  Problem Relation Age of Onset  .  Arthritis Other   . High blood pressure Father   . High blood pressure Sister      ROS:  Please see the history of present illness.  No F/C   No cough All other ROS reviewed and negative.     Physical Exam/Data:   Vitals:   11/27/19 1130 11/27/19 1200 11/27/19 1230 11/27/19 1300  BP: (!) 150/97 (!) 154/101 (!) 147/102 (!) 173/104  Pulse: 66 69 63 93  Resp: 15 16 14 20   SpO2: 99% 99% 100% 98%  Weight:      Height:       No intake or output data in the 24 hours ending 11/27/19 1333 Last 3 Weights 11/27/2019 06/15/2019 10/06/2017  Weight (lbs) 180 lb 178 lb 175 lb  Weight (kg) 81.647 kg 80.74 kg 79.379 kg     Body mass index is 25.1 kg/m.  General:  Well nourished, well developed, in no acute distress HEENT: normal Lymph: no adenopathy Neck: no JVD  No bruits  i Endocrine:  No thryomegaly Vascular: No carotid bruits; FA pulses 2+ bilaterally without bruits  Cardiac:  normal S1, S2; RRR; no murmur  Lungs:  clear to auscultation bilaterally, no wheezing, rhonchi or  rales  Abd: soft, nontender, no hepatomegaly  Ext: no edema Musculoskeletal:  No deformities, BUE and BLE strength normal and equal Skin: warm and dry  Neuro:  CNs 2-12 intact, no focal abnormalities noted Psych:  Normal affect   EKG:  The EKG was personally reviewed and demonstrates:  SR 86 bpm   rsR1 V1/V2   Sl sagging of ST segments V5/V6   Nonspecific Telemetry:  Telemetry was personally reviewed and demonstrates:  SR   Relevant CV Studies: None   Laboratory Data:  High Sensitivity Troponin:   Recent Labs  Lab 11/27/19 0939 11/27/19 1141  TROPONINIHS 3 18*     Chemistry Recent Labs  Lab 11/27/19 0939  NA 138  K 4.4  CL 102  CO2 25  GLUCOSE 102*  BUN 12  CREATININE 1.00  CALCIUM 9.2  GFRNONAA >60  GFRAA >60  ANIONGAP 11    No results for input(s): PROT, ALBUMIN, AST, ALT, ALKPHOS, BILITOT in the last 168 hours. Hematology Recent Labs  Lab 11/27/19 0939  WBC 8.6  RBC 5.30  HGB 17.1*  HCT 49.6  MCV 93.6  MCH 32.3  MCHC 34.5  RDW 12.3  PLT 204   BNPNo results for input(s): BNP, PROBNP in the last 168 hours.  DDimer No results for input(s): DDIMER in the last 168 hours.   Radiology/Studies:  DG Chest 2 View  Result Date: 11/27/2019 CLINICAL DATA:  Chest pain, diaphoresis, shoulder pain 30 minutes ago EXAM: CHEST - 2 VIEW COMPARISON:  10/07/2016 FINDINGS: The heart size and mediastinal contours are within normal limits. Both lungs are clear. The visualized skeletal structures are unremarkable. IMPRESSION: No active cardiopulmonary disease. Electronically Signed   By: Kathreen Devoid   On: 11/27/2019 10:47   {    Assessment and Plan:   1. Chest pain/[place].   Pt is a 44 yo with hx of HTN and tobacco use  Presents with 20 min of CP  Not with activity at time of onset  But accompanied by diaphoresis, arm discomfort and N/   Gone now  EKG without significant change   Labs signif for trop 3, 18, 38   Given Hx of event, strong tobacco use, continued climb of  troponin I would recomm admit to continue to Rx  Plan for L heart cath  Unfortunately cannot be done today   Will place on sched for Monday at [facility]    Would recomm continuing to track trop Metoprolol for HR/BP control Continue ASA Add Lipitor 80 mg  Would start heparin    Stop if troponin stabilizes/declines and pt remains pain free     2  HTN   BP elevated   Start with b blocker   Follow   3  Tob abuse   Counseled on cessation  4  HL  LDL 129  HDL 53  Trig 58   Start Lipitor for now.       For questions or updates, please contact [facility] Please consult [email or web address] for contact info under    Signed, [name], MD  11/27/2019 1:33 PM

## 2019-11-27 NOTE — ED Notes (Signed)
Attempted report x1. 

## 2019-11-28 ENCOUNTER — Inpatient Hospital Stay (HOSPITAL_COMMUNITY): Payer: BC Managed Care – PPO

## 2019-11-28 DIAGNOSIS — I200 Unstable angina: Secondary | ICD-10-CM

## 2019-11-28 DIAGNOSIS — I361 Nonrheumatic tricuspid (valve) insufficiency: Secondary | ICD-10-CM

## 2019-11-28 DIAGNOSIS — Q231 Congenital insufficiency of aortic valve: Secondary | ICD-10-CM

## 2019-11-28 DIAGNOSIS — I351 Nonrheumatic aortic (valve) insufficiency: Secondary | ICD-10-CM

## 2019-11-28 DIAGNOSIS — Q2381 Bicuspid aortic valve: Secondary | ICD-10-CM

## 2019-11-28 HISTORY — DX: Congenital insufficiency of aortic valve: Q23.1

## 2019-11-28 HISTORY — DX: Bicuspid aortic valve: Q23.81

## 2019-11-28 LAB — BASIC METABOLIC PANEL
Anion gap: 9 (ref 5–15)
BUN: 13 mg/dL (ref 6–20)
CO2: 20 mmol/L — ABNORMAL LOW (ref 22–32)
Calcium: 9.2 mg/dL (ref 8.9–10.3)
Chloride: 109 mmol/L (ref 98–111)
Creatinine, Ser: 0.95 mg/dL (ref 0.61–1.24)
GFR calc Af Amer: >60 mL/min (ref >60)
GFR calc non Af Amer: >60 mL/min (ref >60)
Glucose, Bld: 101 mg/dL — ABNORMAL HIGH (ref 70–99)
Potassium: 4.2 mmol/L (ref 3.5–5.1)
Sodium: 138 mmol/L (ref 135–145)

## 2019-11-28 LAB — CBC
HCT: 46.6 % (ref 39.0–52.0)
Hemoglobin: 16 g/dL (ref 13.0–17.0)
MCH: 32.7 pg (ref 26.0–34.0)
MCHC: 34.3 g/dL (ref 30.0–36.0)
MCV: 95.1 fL (ref 80.0–100.0)
Platelets: 182 10*3/uL (ref 150–400)
RBC: 4.9 MIL/uL (ref 4.22–5.81)
RDW: 12.2 % (ref 11.5–15.5)
WBC: 9.2 10*3/uL (ref 4.0–10.5)
nRBC: 0 % (ref 0.0–0.2)

## 2019-11-28 LAB — HEPARIN LEVEL (UNFRACTIONATED): Heparin Unfractionated: 0.34 IU/mL (ref 0.30–0.70)

## 2019-11-28 LAB — ECHOCARDIOGRAM COMPLETE
Height: 71 in
Weight: 2873.6 oz

## 2019-11-28 MED ORDER — NICOTINE 21 MG/24HR TD PT24
21.0000 mg | MEDICATED_PATCH | Freq: Every day | TRANSDERMAL | Status: DC | PRN
  Administered 2019-11-28 – 2019-11-29 (×2): 21 mg via TRANSDERMAL
  Filled 2019-11-28 (×2): qty 1

## 2019-11-28 MED ORDER — ATORVASTATIN CALCIUM 80 MG PO TABS
80.0000 mg | ORAL_TABLET | Freq: Every day | ORAL | Status: DC
  Administered 2019-11-28 – 2019-11-30 (×3): 80 mg via ORAL
  Filled 2019-11-28 (×3): qty 1

## 2019-11-28 MED ORDER — MELATONIN 3 MG PO TABS
3.0000 mg | ORAL_TABLET | Freq: Every evening | ORAL | Status: DC | PRN
  Administered 2019-11-28 – 2019-11-29 (×2): 3 mg via ORAL
  Filled 2019-11-28 (×2): qty 1

## 2019-11-28 MED ORDER — CALCIUM CARBONATE ANTACID 500 MG PO CHEW
1.0000 | CHEWABLE_TABLET | Freq: Three times a day (TID) | ORAL | Status: DC | PRN
  Administered 2019-11-28: 200 mg via ORAL
  Filled 2019-11-28: qty 1

## 2019-11-28 MED ORDER — NICOTINE POLACRILEX 2 MG MT GUM
4.0000 mg | CHEWING_GUM | OROMUCOSAL | Status: DC | PRN
  Administered 2019-11-28: 4 mg via ORAL
  Filled 2019-11-28 (×3): qty 2

## 2019-11-28 NOTE — Progress Notes (Signed)
ANTICOAGULATION CONSULT NOTE   Pharmacy Consult for heparin dosing Indication: ACS/STEMI  No Known Allergies  Patient Measurements: Height: 5\' 11"  (180.3 cm) Weight: 81.5 kg (179 lb 9.6 oz) IBW/kg (Calculated) : 75.3 HEPARIN DW (KG): 82.2  Vital Signs: Temp: 97.7 F (36.5 C) (06/05 0756) Temp Source: Oral (06/05 0756) BP: 129/96 (06/05 0756) Pulse Rate: 73 (06/05 0756)  Labs: Recent Labs    11/27/19 0939 11/27/19 0939 11/27/19 1141 11/27/19 1332 11/27/19 2123 11/27/19 2159 11/28/19 0649  HGB 17.1*   < >  --   --  17.3*  --  16.0  HCT 49.6  --   --   --  49.5  --  46.6  PLT 204  --   --   --  226  --  182  HEPARINUNFRC  --   --   --   --   --  0.38 0.34  CREATININE 1.00  --   --   --  1.00  --  0.95  TROPONINIHS 3  --  18* 38*  --   --   --    < > = values in this interval not displayed.    Estimated Creatinine Clearance: 104.6 mL/min (by C-G formula based on SCr of 0.95 mg/dL).   Medical History: Past Medical History:  Diagnosis Date  . HTN (hypertension)   . Sleep walking     Assessment: Pharmacy consulted to dose heparin infusion for this 44 yo male with chest pain and elevated  troponins. Patient wasn't taking any anti-coagulant medications prior to admission.   Heparin level this morning remains therapeutic at 0.34 on 1100 units/hour. H&H is stable at 16.0/46.6, plts wnl at 182. Plan for cath on 6/7. Will empirically increase heparin infusion rate to prevent from heparin level falling to a subtherapeutic level.   Goal of Therapy:  Heparin level 0.3-0.7 units/ml Monitor platelets by anticoagulation protocol: Yes   Plan:  Slightly increase to heparin 1200 units/h Daily heparin level and CBC Monitor for bleeding  Follow up heparin infusion LOT    Thank you,   8/7, PharmD PGY-1 Pharmacy Resident   Please check amion for clinical pharmacist contact number

## 2019-11-28 NOTE — Progress Notes (Signed)
Progress Note  Patient Name: Luis Chang Date of Encounter: 11/28/2019  Primary Cardiologist: No primary care provider on file.   Subjective   No chest pain or sob. C/o being anxious not smoking.  Inpatient Medications    Scheduled Meds: . aspirin EC  81 mg Oral Daily  . atorvastatin  80 mg Oral Daily  . metoprolol tartrate  25 mg Oral BID  . nicotine  21 mg Transdermal Once   Continuous Infusions: . heparin 1,200 Units/hr (11/28/19 1040)   PRN Meds: acetaminophen, calcium carbonate, nicotine polacrilex, nitroGLYCERIN, ondansetron (ZOFRAN) IV   Vital Signs    Vitals:   11/28/19 0138 11/28/19 0633 11/28/19 0756 11/28/19 1116  BP: 124/75 (!) 129/92 (!) 129/96 (!) 132/91  Pulse: 63 77 73 62  Resp: 16 14 18 18   Temp: 98.2 F (36.8 C) 98.2 F (36.8 C) 97.7 F (36.5 C) 97.7 F (36.5 C)  TempSrc: Oral Oral Oral   SpO2: 98% 98% 98% 95%  Weight: 81.5 kg     Height:        Intake/Output Summary (Last 24 hours) at 11/28/2019 1129 Last data filed at 11/28/2019 0700 Gross per 24 hour  Intake 887.86 ml  Output 500 ml  Net 387.86 ml   Filed Weights   11/27/19 0924 11/27/19 2111 11/28/19 0138  Weight: 81.6 kg 82.2 kg 81.5 kg    Telemetry    nsr - Personally Reviewed  ECG    nsr - Personally Reviewed  Physical Exam   GEN: No acute distress.   Neck: No JVD Cardiac: RRR, no murmurs, rubs, or gallops.  Respiratory: Clear to auscultation bilaterally. GI: Soft, nontender, non-distended  MS: No edema; No deformity. Neuro:  Nonfocal  Psych: Normal affect   Labs    Chemistry Recent Labs  Lab 11/27/19 0939 11/27/19 2123 11/28/19 0649  NA 138  --  138  K 4.4  --  4.2  CL 102  --  109  CO2 25  --  20*  GLUCOSE 102*  --  101*  BUN 12  --  13  CREATININE 1.00 1.00 0.95  CALCIUM 9.2  --  9.2  GFRNONAA >60 >60 >60  GFRAA >60 >60 >60  ANIONGAP 11  --  9     Hematology Recent Labs  Lab 11/27/19 0939 11/27/19 2123 11/28/19 0649  WBC 8.6 11.2* 9.2  RBC  5.30 5.30 4.90  HGB 17.1* 17.3* 16.0  HCT 49.6 49.5 46.6  MCV 93.6 93.4 95.1  MCH 32.3 32.6 32.7  MCHC 34.5 34.9 34.3  RDW 12.3 12.1 12.2  PLT 204 226 182    Cardiac EnzymesNo results for input(s): TROPONINI in the last 168 hours. No results for input(s): TROPIPOC in the last 168 hours.   BNPNo results for input(s): BNP, PROBNP in the last 168 hours.   DDimer No results for input(s): DDIMER in the last 168 hours.   Radiology    DG Chest 2 View  Result Date: 11/27/2019 CLINICAL DATA:  Chest pain, diaphoresis, shoulder pain 30 minutes ago EXAM: CHEST - 2 VIEW COMPARISON:  10/07/2016 FINDINGS: The heart size and mediastinal contours are within normal limits. Both lungs are clear. The visualized skeletal structures are unremarkable. IMPRESSION: No active cardiopulmonary disease. Electronically Signed   By: Kathreen Devoid   On: 11/27/2019 10:47    Cardiac Studies   none  Patient Profile     44 y.o. male admitted with [place]. He has been pain free on IV heparin  Assessment & Plan    `1. [place] - he will continue IV heparin and undergo left heart cath on Monday.  2. Tobacco abuse - he is having some withdrawal from nicotine. He will continue the patch and use nicorette gum. 3. HTN -his bp is improved.  For questions or updates, please contact [facility] Please consult [email or web address] for contact info under Cardiology/STEMI.   Signed, Lewayne Bunting, MD  11/28/2019, 11:29 AM  Patient ID: Luis Chang, male   DOB: [birth year], 44 y.o.   MRN: [id number]

## 2019-11-28 NOTE — Progress Notes (Signed)
  Echocardiogram 2D Echocardiogram has been performed.  Luis Chang F 11/28/2019, 10:31 AM

## 2019-11-29 LAB — CBC
HCT: 49.7 % (ref 39.0–52.0)
Hemoglobin: 17 g/dL (ref 13.0–17.0)
MCH: 32.6 pg (ref 26.0–34.0)
MCHC: 34.2 g/dL (ref 30.0–36.0)
MCV: 95.2 fL (ref 80.0–100.0)
Platelets: 192 10*3/uL (ref 150–400)
RBC: 5.22 MIL/uL (ref 4.22–5.81)
RDW: 12.2 % (ref 11.5–15.5)
WBC: 8.2 10*3/uL (ref 4.0–10.5)
nRBC: 0 % (ref 0.0–0.2)

## 2019-11-29 LAB — HEPARIN LEVEL (UNFRACTIONATED): Heparin Unfractionated: 0.48 IU/mL (ref 0.30–0.70)

## 2019-11-29 MED ORDER — ASPIRIN 81 MG PO CHEW
81.0000 mg | CHEWABLE_TABLET | ORAL | Status: AC
  Administered 2019-11-30: 81 mg via ORAL
  Filled 2019-11-29: qty 1

## 2019-11-29 MED ORDER — SODIUM CHLORIDE 0.9 % IV SOLN: 250.0000 mL | INTRAVENOUS | Status: DC | PRN

## 2019-11-29 MED ORDER — SODIUM CHLORIDE 0.9 % IV SOLN: INTRAVENOUS | Status: DC

## 2019-11-29 NOTE — Progress Notes (Signed)
ANTICOAGULATION CONSULT NOTE   Pharmacy Consult for heparin dosing Indication: ACS/STEMI  No Known Allergies  Patient Measurements: Height: 5\' 11"  (180.3 cm) Weight: 79.9 kg (176 lb 1.6 oz) IBW/kg (Calculated) : 75.3 HEPARIN DW (KG): 82.2  Vital Signs: Temp: 97.6 F (36.4 C) (06/06 0444) Temp Source: Oral (06/06 0444) BP: 103/63 (06/06 0444) Pulse Rate: 61 (06/06 0444)  Labs: Recent Labs    11/27/19 0939 11/27/19 0939 11/27/19 1141 11/27/19 1332 11/27/19 2123 11/27/19 2123 11/27/19 2159 11/28/19 0649 11/29/19 0523  HGB 17.1*   < >  --   --  17.3*   < >  --  16.0 17.0  HCT 49.6   < >  --   --  49.5  --   --  46.6 49.7  PLT 204   < >  --   --  226  --   --  182 192  HEPARINUNFRC  --   --   --   --   --   --  0.38 0.34 0.48  CREATININE 1.00  --   --   --  1.00  --   --  0.95  --   TROPONINIHS 3  --  18* 38*  --   --   --   --   --    < > = values in this interval not displayed.    Estimated Creatinine Clearance: 104.6 mL/min (by C-G formula based on SCr of 0.95 mg/dL).   Medical History: Past Medical History:  Diagnosis Date  . HTN (hypertension)   . Sleep walking     Assessment: Pharmacy consulted to dose heparin infusion for this 44 yo male with chest pain and elevated  troponins. Patient wasn't taking any anti-coagulant medications prior to admission.   Heparin level this morning remains therapeutic at 0.48 after slight rate increase yesterday. H&H remains stable at 17.0/49.7, plts wnl and stable at 192. No bleeding noted.    Goal of Therapy:  Heparin level 0.3-0.7 units/ml Monitor platelets by anticoagulation protocol: Yes   Plan:  Continue heparin 1200 units/h Daily heparin level and CBC Monitor for bleeding  Cath planned for 6/7   Thank you,   8/7, PharmD PGY-1 Pharmacy Resident   Please check amion for clinical pharmacist contact number

## 2019-11-29 NOTE — Progress Notes (Signed)
Progress Note  Patient Name: Luis Chang Date of Encounter: 11/29/2019  Primary Cardiologist: No primary care provider on file.   Subjective   Very subtle chest pressure, now resolved  Inpatient Medications    Scheduled Meds: . aspirin EC  81 mg Oral Daily  . atorvastatin  80 mg Oral Daily  . metoprolol tartrate  25 mg Oral BID   Continuous Infusions: . heparin 1,200 Units/hr (11/28/19 1040)   PRN Meds: acetaminophen, calcium carbonate, melatonin, nicotine, nicotine polacrilex, nitroGLYCERIN, ondansetron (ZOFRAN) IV   Vital Signs    Vitals:   11/28/19 1610 11/28/19 1957 11/29/19 0049 11/29/19 0444  BP: 115/76 (!) 125/93 117/68 103/63  Pulse: 71 69 (!) 57 61  Resp:  18 18 18   Temp: 97.8 F (36.6 C) (!) 97.5 F (36.4 C) 97.7 F (36.5 C) 97.6 F (36.4 C)  TempSrc:  Oral Oral Oral  SpO2: 97% 98% 98% 97%  Weight:    79.9 kg  Height:        Intake/Output Summary (Last 24 hours) at 11/29/2019 0949 Last data filed at 11/28/2019 2124 Gross per 24 hour  Intake 374.83 ml  Output 600 ml  Net -225.17 ml   Filed Weights   11/27/19 2111 11/28/19 0138 11/29/19 0444  Weight: 82.2 kg 81.5 kg 79.9 kg    Telemetry    nsr - Personally Reviewed  ECG    none - Personally Reviewed  Physical Exam   GEN: No acute distress.   Neck: No JVD Cardiac: RRR, no murmurs, rubs, or gallops.  Respiratory: Clear to auscultation bilaterally. GI: Soft, nontender, non-distended  MS: No edema; No deformity. Neuro:  Nonfocal  Psych: Normal affect   Labs    Chemistry Recent Labs  Lab 11/27/19 0939 11/27/19 2123 11/28/19 0649  NA 138  --  138  K 4.4  --  4.2  CL 102  --  109  CO2 25  --  20*  GLUCOSE 102*  --  101*  BUN 12  --  13  CREATININE 1.00 1.00 0.95  CALCIUM 9.2  --  9.2  GFRNONAA >60 >60 >60  GFRAA >60 >60 >60  ANIONGAP 11  --  9     Hematology Recent Labs  Lab 11/27/19 2123 11/28/19 0649 11/29/19 0523  WBC 11.2* 9.2 8.2  RBC 5.30 4.90 5.22  HGB 17.3*  16.0 17.0  HCT 49.5 46.6 49.7  MCV 93.4 95.1 95.2  MCH 32.6 32.7 32.6  MCHC 34.9 34.3 34.2  RDW 12.1 12.2 12.2  PLT 226 182 192    Cardiac EnzymesNo results for input(s): TROPONINI in the last 168 hours. No results for input(s): TROPIPOC in the last 168 hours.   BNPNo results for input(s): BNP, PROBNP in the last 168 hours.   DDimer No results for input(s): DDIMER in the last 168 hours.   Radiology    DG Chest 2 View  Result Date: 11/27/2019 CLINICAL DATA:  Chest pain, diaphoresis, shoulder pain 30 minutes ago EXAM: CHEST - 2 VIEW COMPARISON:  10/07/2016 FINDINGS: The heart size and mediastinal contours are within normal limits. Both lungs are clear. The visualized skeletal structures are unremarkable. IMPRESSION: No active cardiopulmonary disease. Electronically Signed   By: 10/09/2016   On: 11/27/2019 10:47   ECHOCARDIOGRAM COMPLETE  Result Date: 11/28/2019    ECHOCARDIOGRAM REPORT   Patient Name:   Luis Chang Date of Exam: 11/28/2019 Medical Rec #:  01/28/[id number]    Height:  71.0 in Accession #:    [id number]   Weight:       179.6 lb Date of Birth:  26-Apr-1976    BSA:          2.014 m Patient Age:    44 years     BP:           129/96 mmHg Patient Gender: M            HR:           62 bpm. Exam Location:  Inpatient Procedure: 2D Echo, Cardiac Doppler and Color Doppler Indications:    Elevated troponin  History:        Patient has no prior history of Echocardiogram examinations.                 Signs/Symptoms:Chest Pain.  Sonographer:    Merrie Roof RDCS Referring Phys: [id number] PAULA V ROSS IMPRESSIONS  1. Left ventricular ejection fraction, by estimation, is 50 to 55%. The left ventricle has low normal function. The left ventricle has no regional wall motion abnormalities. Left ventricular diastolic parameters were normal.  2. Right ventricular systolic function is normal. The right ventricular size is normal.  3. The mitral valve is myxomatous. No evidence of mitral valve regurgitation. No  evidence of mitral stenosis.  4. Bicuspid valve - right and left cusp fusion. . The aortic valve is bicuspid. Aortic valve regurgitation is mild. Mild to moderate aortic valve sclerosis/calcification is present, without any evidence of aortic stenosis.  5. The inferior vena cava is normal in size with greater than 50% respiratory variability, suggesting right atrial pressure of 3 mmHg. FINDINGS  Left Ventricle: Left ventricular ejection fraction, by estimation, is 50 to 55%. The left ventricle has low normal function. The left ventricle has no regional wall motion abnormalities. The left ventricular internal cavity size was normal in size. There is no left ventricular hypertrophy. Left ventricular diastolic parameters were normal. Right Ventricle: The right ventricular size is normal. No increase in right ventricular wall thickness. Right ventricular systolic function is normal. Left Atrium: Left atrial size was normal in size. Right Atrium: Right atrial size was normal in size. Pericardium: There is no evidence of pericardial effusion. Mitral Valve: The mitral valve is myxomatous. There is mild thickening of the mitral valve leaflet(s). Normal mobility of the mitral valve leaflets. No evidence of mitral valve regurgitation. No evidence of mitral valve stenosis. Tricuspid Valve: The tricuspid valve is normal in structure. Tricuspid valve regurgitation is mild . No evidence of tricuspid stenosis. Aortic Valve: Bicuspid valve - right and left cusp fusion. The aortic valve is bicuspid. . There is moderate thickening and moderate calcification of the aortic valve. Aortic valve regurgitation is mild. Aortic regurgitation PHT measures 740 msec. Mild to moderate aortic valve sclerosis/calcification is present, without any evidence of aortic stenosis. There is moderate thickening of the aortic valve. There is moderate calcification of the aortic valve. Pulmonic Valve: The pulmonic valve was normal in structure. Pulmonic  valve regurgitation is not visualized. No evidence of pulmonic stenosis. Aorta: The aortic root is normal in size and structure. Venous: The inferior vena cava is normal in size with greater than 50% respiratory variability, suggesting right atrial pressure of 3 mmHg. IAS/Shunts: No atrial level shunt detected by color flow Doppler.  LEFT VENTRICLE PLAX 2D LVIDd:         4.50 cm     Diastology LVIDs:         3.20 cm  LV e' lateral: 11.60 cm/s LV PW:         0.90 cm LV IVS:        0.80 cm LVOT diam:     1.90 cm LV SV:         44 LV SV Index:   22 LVOT Area:     2.84 cm  LV Volumes (MOD) LV vol d, MOD A2C: 92.7 ml LV vol d, MOD A4C: 82.4 ml LV vol s, MOD A2C: 30.3 ml LV vol s, MOD A4C: 42.3 ml LV SV MOD A2C:     62.4 ml LV SV MOD A4C:     82.4 ml LV SV MOD BP:      51.6 ml RIGHT VENTRICLE RV Basal diam:  3.90 cm RV S prime:     11.40 cm/s TAPSE (M-mode): 2.4 cm LEFT ATRIUM             Index       RIGHT ATRIUM           Index LA diam:        3.60 cm 1.79 cm/m  RA Area:     14.20 cm LA Vol (A2C):   55.4 ml 27.50 ml/m RA Volume:   38.80 ml  19.26 ml/m LA Vol (A4C):   34.5 ml 17.13 ml/m LA Biplane Vol: 43.8 ml 21.74 ml/m  AORTIC VALVE LVOT Vmax:   68.50 cm/s LVOT Vmean:  47.800 cm/s LVOT VTI:    0.154 m AI PHT:      740 msec  AORTA Ao Root diam: 3.50 cm Ao Asc diam:  2.80 cm  SHUNTS Systemic VTI:  0.15 m Systemic Diam: 1.90 cm Donato Schultz MD Electronically signed by Donato Schultz MD Signature Date/Time: 11/28/2019/1:16:25 PM    Final     Cardiac Studies   none  Patient Profile     44 y.o. male admitted with [place]  Assessment & Plan    1. [place] - he has had very minimal pain. He will continue his current meds and left heart cath tomorrow. 2. Tobacco abuse - nicotine craving is improved.  3. HTN - his bp is well controlled. We will follow.  For questions or updates, please contact [facility] Please consult [email or web address] for contact info under Cardiology/STEMI.   Signed, Lewayne Bunting, MD    11/29/2019, 9:49 AM  Patient ID: [name], male   DOB: Feb 09, 1976, 44 y.o.   MRN: [id number]

## 2019-11-29 NOTE — Plan of Care (Signed)
  Problem: Pain Managment: Goal: General experience of comfort will improve Outcome: Progressing   

## 2019-11-30 ENCOUNTER — Encounter (HOSPITAL_COMMUNITY)
Admission: EM | Disposition: A | Discharge status: Home / Self Care | Payer: Self-pay | Source: Home / Self Care | Attending: Internal Medicine

## 2019-11-30 ENCOUNTER — Encounter (HOSPITAL_COMMUNITY): Payer: Self-pay | Admitting: Internal Medicine

## 2019-11-30 DIAGNOSIS — E785 Hyperlipidemia, unspecified: Secondary | ICD-10-CM

## 2019-11-30 DIAGNOSIS — I2510 Atherosclerotic heart disease of native coronary artery without angina pectoris: Secondary | ICD-10-CM

## 2019-11-30 DIAGNOSIS — I25110 Atherosclerotic heart disease of native coronary artery with unstable angina pectoris: Secondary | ICD-10-CM

## 2019-11-30 HISTORY — DX: Atherosclerotic heart disease of native coronary artery without angina pectoris: I25.10

## 2019-11-30 HISTORY — DX: Hyperlipidemia, unspecified: E78.5

## 2019-11-30 HISTORY — PX: LEFT HEART CATH AND CORONARY ANGIOGRAPHY: CATH118249

## 2019-11-30 LAB — CBC
HCT: 47.3 % (ref 39.0–52.0)
Hemoglobin: 16.5 g/dL (ref 13.0–17.0)
MCH: 32.6 pg (ref 26.0–34.0)
MCHC: 34.9 g/dL (ref 30.0–36.0)
MCV: 93.5 fL (ref 80.0–100.0)
Platelets: 182 10*3/uL (ref 150–400)
RBC: 5.06 MIL/uL (ref 4.22–5.81)
RDW: 12 % (ref 11.5–15.5)
WBC: 8.4 10*3/uL (ref 4.0–10.5)
nRBC: 0 % (ref 0.0–0.2)

## 2019-11-30 LAB — HEPATIC FUNCTION PANEL
ALT: 43 U/L (ref 0–44)
AST: 41 U/L (ref 15–41)
Albumin: 3.8 g/dL (ref 3.5–5.0)
Alkaline Phosphatase: 62 U/L (ref 38–126)
Bilirubin, Direct: 0.2 mg/dL (ref 0.0–0.2)
Indirect Bilirubin: 0.4 mg/dL (ref 0.3–0.9)
Total Bilirubin: 0.6 mg/dL (ref 0.3–1.2)
Total Protein: 6 g/dL — ABNORMAL LOW (ref 6.5–8.1)

## 2019-11-30 LAB — HEPARIN LEVEL (UNFRACTIONATED): Heparin Unfractionated: 0.39 IU/mL (ref 0.30–0.70)

## 2019-11-30 SURGERY — LEFT HEART CATH AND CORONARY ANGIOGRAPHY
Anesthesia: LOCAL

## 2019-11-30 MED ORDER — SODIUM CHLORIDE 0.9% FLUSH: 3.0000 mL | Freq: Two times a day (BID) | INTRAVENOUS | Status: DC

## 2019-11-30 MED ORDER — SODIUM CHLORIDE 0.9% FLUSH: 3.0000 mL | INTRAVENOUS | Status: DC | PRN

## 2019-11-30 MED ORDER — NITROGLYCERIN 0.4 MG SL SUBL: 0.4000 mg | SUBLINGUAL_TABLET | SUBLINGUAL | 12 refills | Status: DC | PRN

## 2019-11-30 MED ORDER — LIDOCAINE HCL (PF) 1 % IJ SOLN
INTRAMUSCULAR | Status: DC | PRN
  Administered 2019-11-30: 2 mL

## 2019-11-30 MED ORDER — MIDAZOLAM HCL 2 MG/2ML IJ SOLN
INTRAMUSCULAR | Status: DC | PRN
  Administered 2019-11-30 (×2): 1 mg via INTRAVENOUS

## 2019-11-30 MED ORDER — ENOXAPARIN SODIUM 40 MG/0.4ML ~~LOC~~ SOLN: 40.0000 mg | SUBCUTANEOUS | Status: DC

## 2019-11-30 MED ORDER — CALCIUM CARBONATE ANTACID 500 MG PO CHEW: 1.0000 | CHEWABLE_TABLET | Freq: Three times a day (TID) | ORAL | Status: DC | PRN

## 2019-11-30 MED ORDER — HEPARIN (PORCINE) IN NACL 1000-0.9 UT/500ML-% IV SOLN
INTRAVENOUS | Status: AC
  Filled 2019-11-30: qty 1000

## 2019-11-30 MED ORDER — LABETALOL HCL 5 MG/ML IV SOLN: 10.0000 mg | INTRAVENOUS | Status: DC | PRN

## 2019-11-30 MED ORDER — MIDAZOLAM HCL 2 MG/2ML IJ SOLN
INTRAMUSCULAR | Status: AC
  Filled 2019-11-30: qty 2

## 2019-11-30 MED ORDER — HEPARIN SODIUM (PORCINE) 1000 UNIT/ML IJ SOLN
INTRAMUSCULAR | Status: DC | PRN
  Administered 2019-11-30: 4000 [IU] via INTRAVENOUS

## 2019-11-30 MED ORDER — HEPARIN SODIUM (PORCINE) 1000 UNIT/ML IJ SOLN
INTRAMUSCULAR | Status: AC
  Filled 2019-11-30: qty 1

## 2019-11-30 MED ORDER — FENTANYL CITRATE (PF) 100 MCG/2ML IJ SOLN
INTRAMUSCULAR | Status: DC | PRN
  Administered 2019-11-30: 25 ug via INTRAVENOUS
  Administered 2019-11-30: 50 ug via INTRAVENOUS

## 2019-11-30 MED ORDER — ASPIRIN 81 MG PO TBEC: 81.0000 mg | DELAYED_RELEASE_TABLET | Freq: Every day | ORAL | Status: AC

## 2019-11-30 MED ORDER — LIDOCAINE HCL (PF) 1 % IJ SOLN
INTRAMUSCULAR | Status: AC
  Filled 2019-11-30: qty 30

## 2019-11-30 MED ORDER — SODIUM CHLORIDE 0.9 % IV SOLN: 250.0000 mL | INTRAVENOUS | Status: DC | PRN

## 2019-11-30 MED ORDER — VERAPAMIL HCL 2.5 MG/ML IV SOLN
INTRAVENOUS | Status: AC
  Filled 2019-11-30: qty 2

## 2019-11-30 MED ORDER — METOPROLOL SUCCINATE ER 50 MG PO TB24
50.0000 mg | ORAL_TABLET | Freq: Every day | ORAL | 11 refills | Status: DC
Start: 2019-11-30 — End: 2020-02-02

## 2019-11-30 MED ORDER — MELATONIN 3 MG PO TABS: 3.0000 mg | ORAL_TABLET | Freq: Every evening | ORAL | 0 refills | Status: DC | PRN

## 2019-11-30 MED ORDER — METOPROLOL TARTRATE 25 MG PO TABS: 25.0000 mg | ORAL_TABLET | Freq: Two times a day (BID) | ORAL | 11 refills | Status: DC

## 2019-11-30 MED ORDER — HEPARIN (PORCINE) IN NACL 1000-0.9 UT/500ML-% IV SOLN
INTRAVENOUS | Status: DC | PRN
  Administered 2019-11-30 (×2): 500 mL

## 2019-11-30 MED ORDER — ROSUVASTATIN CALCIUM 10 MG PO TABS
10.0000 mg | ORAL_TABLET | Freq: Every day | ORAL | 11 refills | Status: DC
Start: 2019-11-30 — End: 2019-11-30

## 2019-11-30 MED ORDER — FENTANYL CITRATE (PF) 100 MCG/2ML IJ SOLN
INTRAMUSCULAR | Status: AC
  Filled 2019-11-30: qty 2

## 2019-11-30 MED ORDER — VERAPAMIL HCL 2.5 MG/ML IV SOLN
INTRAVENOUS | Status: DC | PRN
  Administered 2019-11-30: 10 mL via INTRA_ARTERIAL

## 2019-11-30 MED ORDER — SODIUM CHLORIDE 0.9 % IV SOLN: INTRAVENOUS | Status: DC

## 2019-11-30 MED ORDER — ROSUVASTATIN CALCIUM 20 MG PO TABS
10.0000 mg | ORAL_TABLET | Freq: Every day | ORAL | 11 refills | Status: DC
Start: 2019-11-30 — End: 2020-04-06

## 2019-11-30 MED ORDER — NICOTINE 21 MG/24HR TD PT24: 21.0000 mg | MEDICATED_PATCH | Freq: Every day | TRANSDERMAL | 0 refills | Status: DC | PRN

## 2019-11-30 MED ORDER — HYDRALAZINE HCL 20 MG/ML IJ SOLN: 10.0000 mg | INTRAMUSCULAR | Status: DC | PRN

## 2019-11-30 MED ORDER — IOHEXOL 350 MG/ML SOLN
INTRAVENOUS | Status: DC | PRN
  Administered 2019-11-30: 35 mL

## 2019-11-30 SURGICAL SUPPLY — 8 items
CATH 5FR JL3.5 JR4 ANG PIG MP (CATHETERS) ×1 IMPLANT
GLIDESHEATH SLEND SS 6F .021 (SHEATH) ×1 IMPLANT
GUIDEWIRE INQWIRE 1.5J.035X260 (WIRE) IMPLANT
INQWIRE 1.5J .035X260CM (WIRE) ×2
KIT HEART LEFT (KITS) ×2 IMPLANT
PACK CARDIAC CATHETERIZATION (CUSTOM PROCEDURE TRAY) ×2 IMPLANT
TRANSDUCER W/STOPCOCK (MISCELLANEOUS) ×2 IMPLANT
TUBING CIL FLEX 10 FLL-RA (TUBING) ×2 IMPLANT

## 2019-11-30 NOTE — Plan of Care (Signed)
Problem: Education: Goal: Understanding of CV disease, CV risk reduction, and recovery process will improve 11/30/2019 1602 by Rolm Baptise, RN Outcome: Adequate for Discharge 11/30/2019 1602 by Rolm Baptise, RN Outcome: Adequate for Discharge Goal: Individualized Educational Video(s) 11/30/2019 1602 by Rolm Baptise, RN Outcome: Adequate for Discharge 11/30/2019 1602 by Rolm Baptise, RN Outcome: Adequate for Discharge   Problem: Activity: Goal: Ability to return to baseline activity level will improve 11/30/2019 1602 by Rolm Baptise, RN Outcome: Adequate for Discharge 11/30/2019 1602 by Rolm Baptise, RN Outcome: Adequate for Discharge   Problem: Cardiovascular: Goal: Ability to achieve and maintain adequate cardiovascular perfusion will improve 11/30/2019 1602 by Rolm Baptise, RN Outcome: Adequate for Discharge 11/30/2019 1602 by Rolm Baptise, RN Outcome: Adequate for Discharge Goal: Vascular access site(s) Level 0-1 will be maintained 11/30/2019 1602 by Rolm Baptise, RN Outcome: Adequate for Discharge 11/30/2019 1602 by Rolm Baptise, RN Outcome: Adequate for Discharge   Problem: Health Behavior/Discharge Planning: Goal: Ability to safely manage health-related needs after discharge will improve 11/30/2019 1602 by Rolm Baptise, RN Outcome: Adequate for Discharge 11/30/2019 1602 by Rolm Baptise, RN Outcome: Adequate for Discharge   Problem: Education: Goal: Knowledge of General Education information will improve Description: Including pain rating scale, medication(s)/side effects and non-pharmacologic comfort measures 11/30/2019 1602 by Rolm Baptise, RN Outcome: Adequate for Discharge 11/30/2019 1602 by Rolm Baptise, RN Outcome: Adequate for Discharge   Problem: Health Behavior/Discharge Planning: Goal: Ability to manage health-related needs will improve 11/30/2019 1602 by Rolm Baptise, RN Outcome: Adequate for Discharge 11/30/2019 1602 by Rolm Baptise, RN Outcome: Adequate for Discharge   Problem: Clinical Measurements: Goal: Ability to maintain clinical measurements within normal limits will improve 11/30/2019 1602 by Rolm Baptise, RN Outcome: Adequate for Discharge 11/30/2019 1602 by Rolm Baptise, RN Outcome: Adequate for Discharge Goal: Will remain free from infection 11/30/2019 1602 by Rolm Baptise, RN Outcome: Adequate for Discharge 11/30/2019 1602 by Rolm Baptise, RN Outcome: Adequate for Discharge Goal: Diagnostic test results will improve 11/30/2019 1602 by Rolm Baptise, RN Outcome: Adequate for Discharge 11/30/2019 1602 by Rolm Baptise, RN Outcome: Adequate for Discharge Goal: Respiratory complications will improve 11/30/2019 1602 by Rolm Baptise, RN Outcome: Adequate for Discharge 11/30/2019 1602 by Rolm Baptise, RN Outcome: Adequate for Discharge Goal: Cardiovascular complication will be avoided 11/30/2019 1602 by Rolm Baptise, RN Outcome: Adequate for Discharge 11/30/2019 1602 by Rolm Baptise, RN Outcome: Adequate for Discharge   Problem: Activity: Goal: Risk for activity intolerance will decrease 11/30/2019 1602 by Rolm Baptise, RN Outcome: Adequate for Discharge 11/30/2019 1602 by Rolm Baptise, RN Outcome: Adequate for Discharge   Problem: Nutrition: Goal: Adequate nutrition will be maintained 11/30/2019 1602 by Rolm Baptise, RN Outcome: Adequate for Discharge 11/30/2019 1602 by Rolm Baptise, RN Outcome: Adequate for Discharge   Problem: Coping: Goal: Level of anxiety will decrease 11/30/2019 1602 by Rolm Baptise, RN Outcome: Adequate for Discharge 11/30/2019 1602 by Rolm Baptise, RN Outcome: Adequate for Discharge   Problem: Elimination: Goal: Will not experience complications related to bowel motility 11/30/2019 1602 by Rolm Baptise, RN Outcome: Adequate for Discharge 11/30/2019 1602 by Rolm Baptise, RN Outcome: Adequate for Discharge Goal: Will not experience  complications related to urinary retention 11/30/2019 1602 by Rolm Baptise, RN Outcome: Adequate for Discharge 11/30/2019 1602 by Rolm Baptise, RN Outcome: Adequate for  Discharge   Problem: Pain Managment: Goal: General experience of comfort will improve 11/30/2019 1602 by Sherian Maroon, RN Outcome: Adequate for Discharge 11/30/2019 1602 by Sherian Maroon, RN Outcome: Adequate for Discharge   Problem: Safety: Goal: Ability to remain free from injury will improve 11/30/2019 1602 by Sherian Maroon, RN Outcome: Adequate for Discharge 11/30/2019 1602 by Sherian Maroon, RN Outcome: Adequate for Discharge   Problem: Skin Integrity: Goal: Risk for impaired skin integrity will decrease 11/30/2019 1602 by Sherian Maroon, RN Outcome: Adequate for Discharge 11/30/2019 1602 by Sherian Maroon, RN Outcome: Adequate for Discharge   Problem: Education: Goal: Understanding of CV disease, CV risk reduction, and recovery process will improve 11/30/2019 1602 by Sherian Maroon, RN Outcome: Adequate for Discharge 11/30/2019 1602 by Sherian Maroon, RN Outcome: Adequate for Discharge Goal: Individualized Educational Video(s) 11/30/2019 1602 by Sherian Maroon, RN Outcome: Adequate for Discharge 11/30/2019 1602 by Sherian Maroon, RN Outcome: Adequate for Discharge   Problem: Activity: Goal: Ability to return to baseline activity level will improve 11/30/2019 1602 by Sherian Maroon, RN Outcome: Adequate for Discharge 11/30/2019 1602 by Sherian Maroon, RN Outcome: Adequate for Discharge   Problem: Cardiovascular: Goal: Ability to achieve and maintain adequate cardiovascular perfusion will improve 11/30/2019 1602 by Sherian Maroon, RN Outcome: Adequate for Discharge 11/30/2019 1602 by Sherian Maroon, RN Outcome: Adequate for Discharge Goal: Vascular access site(s) Level 0-1 will be maintained 11/30/2019 1602 by Sherian Maroon, RN Outcome: Adequate for Discharge 11/30/2019 1602 by Sherian Maroon,  RN Outcome: Adequate for Discharge   Problem: Health Behavior/Discharge Planning: Goal: Ability to safely manage health-related needs after discharge will improve 11/30/2019 1602 by Sherian Maroon, RN Outcome: Adequate for Discharge 11/30/2019 1602 by Sherian Maroon, RN Outcome: Adequate for Discharge

## 2019-11-30 NOTE — Discharge Summary (Addendum)
Discharge Summary    Patient ID: Luis Chang MRN: [id number]; DOB: Sep 07, 1975  Admit date: 11/27/2019 Discharge date: 11/30/2019  Primary Care Provider: [name], MD  Primary Cardiologist: [name], MD  Primary Electrophysiologist:  None   Discharge Diagnoses    Principal Problem:   Unstable angina [facility]) Active Problems:   Dyslipidemia (high LDL; low HDL)   Allergies No Known Allergies  Diagnostic Studies/Procedures    CARDIAC CATH: 11/30/2019 Conclusion: 1. Moderate two-vessel coronary artery disease with 50% lesions involving D2 and mid LCx.  No significant stenosis noted in the LAD or RCA. 2. Normal left ventricular filling pressure.  Recommendations: 1. Aggressive medical therapy and risk factor modifications to prevent progression of disease. Full results below  ECHO: 11/28/2019 1. Left ventricular ejection fraction, by estimation, is 50 to 55%. The left ventricle has low normal function. The left ventricle has no regional wall motion abnormalities. Left ventricular diastolic parameters were normal.  2. Right ventricular systolic function is normal. The right ventricular size is normal.  3. The mitral valve is myxomatous. No evidence of mitral valve regurgitation. No evidence of mitral stenosis.  4. Bicuspid valve - right and left cusp fusion. . The aortic valve is bicuspid. Aortic valve regurgitation is mild. Mild to moderate aortic valve sclerosis/calcification is present, without any evidence of aortic stenosis.  5. The inferior vena cava is normal in size with greater than 50% respiratory variability, suggesting right atrial pressure of 3 mmHg.  _____________   History of Present Illness     Luis Chang is a 44 y.o. male with hx HTN, sleep walking, CP. He came to the ER on 06/04 with chest pain.  He was admitted for further evaluation and treatment.  Hospital Course     Consultants: none   Cardiac enzymes were negative for MI.  He was pain-free  on aspirin, heparin, beta-blocker and statin.  He was taken to the Cath Lab on 11/30/2019.  Results are above he had moderate but nonobstructive disease and medical therapy is recommended.  Lipid profile is below.  With moderate CAD, his goal LDL is now less than 70.  He was started on Crestor 20 mg daily.  LFTs are pending.  He was also started on a beta-blocker, this will be a long-acting preparation.  He is a smoker and was started on a nicotine patch.  This will be continued at discharge, but he was advised that he cannot smoke while wearing the patch.  The importance of smoking cessation was emphasized.   Post-cath, his R radial site was w/out ecchymosis or hematoma. No further inpatient workup was indicated and he is considered stable for discharge, to follow up as an outpatient.   Did the patient have an acute coronary syndrome (MI, NSTEMI, STEMI, etc) this admission?:  No                               Did the patient have a percutaneous coronary intervention (stent / angioplasty)?:  No.   _____________  Discharge Vitals Blood pressure 106/69, pulse 64, temperature 97.8 F (36.6 C), temperature source Oral, resp. rate 16, height 5\' 11"  (1.803 m), weight 80.7 kg, SpO2 97 %.  Filed Weights   11/28/19 0138 11/29/19 0444 11/30/19 0132  Weight: 81.5 kg 79.9 kg 80.7 kg    Labs & Radiologic Studies    CBC Recent Labs    11/29/19 0523 11/30/19 0607  WBC 8.2  8.4  HGB 17.0 16.5  HCT 49.7 47.3  MCV 95.2 93.5  PLT 192 407   Basic Metabolic Panel Recent Labs    11/27/19 2123 11/28/19 0649  NA  --  138  K  --  4.2  CL  --  109  CO2  --  20*  GLUCOSE  --  101*  BUN  --  13  CREATININE 1.00 0.95  CALCIUM  --  9.2   Liver Function Tests Lab Results  Component Value Date   ALT 43 11/30/2019   AST 41 11/30/2019   ALKPHOS 62 11/30/2019   BILITOT 0.6 11/30/2019    High Sensitivity Troponin:   Recent Labs  Lab 11/27/19 0939 11/27/19 1141 11/27/19 1332  TROPONINIHS 3 18*  38*    BNP Invalid input(s): POCBNP D-Dimer No results for input(s): DDIMER in the last 72 hours. Hemoglobin A1C No results for input(s): HGBA1C in the last 72 hours. Fasting Lipid Panel Lab Results  Component Value Date   CHOL 194 11/27/2019   HDL 53 11/27/2019   LDLCALC 129 (H) 11/27/2019   TRIG 58 11/27/2019   CHOLHDL 3.7 11/27/2019   Thyroid Function Tests No results for input(s): TSH, T4TOTAL, T3FREE, THYROIDAB in the last 72 hours.  Invalid input(s): FREET3 _____________  DG Chest 2 View  Result Date: 11/27/2019 CLINICAL DATA:  Chest pain, diaphoresis, shoulder pain 30 minutes ago EXAM: CHEST - 2 VIEW COMPARISON:  10/07/2016 FINDINGS: The heart size and mediastinal contours are within normal limits. Both lungs are clear. The visualized skeletal structures are unremarkable. IMPRESSION: No active cardiopulmonary disease. Electronically Signed   By: Kathreen Devoid   On: 11/27/2019 10:47   CARDIAC CATHETERIZATION  Result Date: 11/30/2019 Conclusion: 1. Moderate two-vessel coronary artery disease with 50% lesions involving D2 and mid LCx.  No significant stenosis noted in the LAD or RCA. 2. Normal left ventricular filling pressure. Recommendations: 1. Aggressive medical therapy and risk factor modifications to prevent progression of disease. Nelva Bush, MD [facility]   ECHOCARDIOGRAM COMPLETE  Result Date: 11/28/2019    ECHOCARDIOGRAM REPORT   Patient Name:   Luis Chang Date of Exam: 11/28/2019 Medical Rec #:  [id number]    Height:       71.0 in Accession #:    [id number]   Weight:       179.6 lb Date of Birth:  1976/02/09    BSA:          2.014 m Patient Age:    40 years     BP:           129/96 mmHg Patient Gender: M            HR:           62 bpm. Exam Location:  Inpatient Procedure: 2D Echo, Cardiac Doppler and Color Doppler Indications:    Elevated troponin  History:        Patient has no prior history of Echocardiogram examinations.                 Signs/Symptoms:Chest  Pain.  Sonographer:    Merrie Roof RDCS Referring Phys: [id number] PAULA V ROSS IMPRESSIONS  1. Left ventricular ejection fraction, by estimation, is 50 to 55%. The left ventricle has low normal function. The left ventricle has no regional wall motion abnormalities. Left ventricular diastolic parameters were normal.  2. Right ventricular systolic function is normal. The right ventricular size is normal.  3. The mitral valve is myxomatous.  No evidence of mitral valve regurgitation. No evidence of mitral stenosis.  4. Bicuspid valve - right and left cusp fusion. . The aortic valve is bicuspid. Aortic valve regurgitation is mild. Mild to moderate aortic valve sclerosis/calcification is present, without any evidence of aortic stenosis.  5. The inferior vena cava is normal in size with greater than 50% respiratory variability, suggesting right atrial pressure of 3 mmHg. FINDINGS  Left Ventricle: Left ventricular ejection fraction, by estimation, is 50 to 55%. The left ventricle has low normal function. The left ventricle has no regional wall motion abnormalities. The left ventricular internal cavity size was normal in size. There is no left ventricular hypertrophy. Left ventricular diastolic parameters were normal. Right Ventricle: The right ventricular size is normal. No increase in right ventricular wall thickness. Right ventricular systolic function is normal. Left Atrium: Left atrial size was normal in size. Right Atrium: Right atrial size was normal in size. Pericardium: There is no evidence of pericardial effusion. Mitral Valve: The mitral valve is myxomatous. There is mild thickening of the mitral valve leaflet(s). Normal mobility of the mitral valve leaflets. No evidence of mitral valve regurgitation. No evidence of mitral valve stenosis. Tricuspid Valve: The tricuspid valve is normal in structure. Tricuspid valve regurgitation is mild . No evidence of tricuspid stenosis. Aortic Valve: Bicuspid valve - right and left  cusp fusion. The aortic valve is bicuspid. . There is moderate thickening and moderate calcification of the aortic valve. Aortic valve regurgitation is mild. Aortic regurgitation PHT measures 740 msec. Mild to moderate aortic valve sclerosis/calcification is present, without any evidence of aortic stenosis. There is moderate thickening of the aortic valve. There is moderate calcification of the aortic valve. Pulmonic Valve: The pulmonic valve was normal in structure. Pulmonic valve regurgitation is not visualized. No evidence of pulmonic stenosis. Aorta: The aortic root is normal in size and structure. Venous: The inferior vena cava is normal in size with greater than 50% respiratory variability, suggesting right atrial pressure of 3 mmHg. IAS/Shunts: No atrial level shunt detected by color flow Doppler.  LEFT VENTRICLE PLAX 2D LVIDd:         4.50 cm     Diastology LVIDs:         3.20 cm     LV e' lateral: 11.60 cm/s LV PW:         0.90 cm LV IVS:        0.80 cm LVOT diam:     1.90 cm LV SV:         44 LV SV Index:   22 LVOT Area:     2.84 cm  LV Volumes (MOD) LV vol d, MOD A2C: 92.7 ml LV vol d, MOD A4C: 82.4 ml LV vol s, MOD A2C: 30.3 ml LV vol s, MOD A4C: 42.3 ml LV SV MOD A2C:     62.4 ml LV SV MOD A4C:     82.4 ml LV SV MOD BP:      51.6 ml RIGHT VENTRICLE RV Basal diam:  3.90 cm RV S prime:     11.40 cm/s TAPSE (M-mode): 2.4 cm LEFT ATRIUM             Index       RIGHT ATRIUM           Index LA diam:        3.60 cm 1.79 cm/m  RA Area:     14.20 cm LA Vol (A2C):   55.4 ml 27.50 ml/m  RA Volume:   38.80 ml  19.26 ml/m LA Vol (A4C):   34.5 ml 17.13 ml/m LA Biplane Vol: 43.8 ml 21.74 ml/m  AORTIC VALVE LVOT Vmax:   68.50 cm/s LVOT Vmean:  47.800 cm/s LVOT VTI:    0.154 m AI PHT:      740 msec  AORTA Ao Root diam: 3.50 cm Ao Asc diam:  2.80 cm  SHUNTS Systemic VTI:  0.15 m Systemic Diam: 1.90 cm Donato Schultz MD Electronically signed by Donato Schultz MD Signature Date/Time: 11/28/2019/1:16:25 PM    Final     Disposition   Pt is being discharged home today in good condition.  Follow-up Plans & Appointments    Follow-up Information    Rheana Casebolt, Joline Salt, PA-C Follow up on 12/11/2019.   Specialties: Cardiology, Radiology Why: Please arrive at 2:15 pm for a 2:30 pm appt.  Contact information: [address] [place] [address] [phone]          Discharge Instructions    Diet - low sodium heart healthy   Complete by: As directed    Increase activity slowly   Complete by: As directed       Discharge Medications   Allergies as of 11/30/2019   No Known Allergies     Medication List    STOP taking these medications   meloxicam 7.5 MG tablet Commonly known as: Mobic     TAKE these medications   acetaminophen 500 MG tablet Commonly known as: TYLENOL Take 500 mg by mouth daily as needed for headache.   Advil 200 MG Caps Generic drug: Ibuprofen Take 1 capsule by mouth daily as needed (headache).   aspirin 81 MG EC tablet Take 1 tablet (81 mg total) by mouth daily. Start taking on: December 01, 2019   calcium carbonate 500 MG chewable tablet Commonly known as: TUMS - dosed in mg elemental calcium Chew 1 tablet (200 mg of elemental calcium total) by mouth 3 (three) times daily as needed for indigestion or heartburn.   melatonin 3 MG Tabs tablet Take 1 tablet (3 mg total) by mouth at bedtime as needed (insomnia).   metoprolol succinate 50 MG 24 hr tablet Commonly known as: Toprol XL Take 1 tablet (50 mg total) by mouth daily. Take with or immediately following a meal.   nicotine 21 mg/24hr patch Commonly known as: NICODERM CQ - dosed in mg/24 hours Place 1 patch (21 mg total) onto the skin daily as needed (nicotine withdrawal).   nitroGLYCERIN 0.4 MG SL tablet Commonly known as: NITROSTAT Place 1 tablet (0.4 mg total) under the tongue every 5 (five) minutes x 3 doses as needed for chest pain.   rosuvastatin 20 MG tablet Commonly known as: Crestor Take 0.5 tablets  (10 mg total) by mouth daily.         Outstanding Labs/Studies   Hepatic function panel   Duration of Discharge Encounter   Greater than 30 minutes including physician time.  Signed, Theodore Demark, PA-C 11/30/2019, 3:33 PM

## 2019-11-30 NOTE — Plan of Care (Signed)

## 2019-11-30 NOTE — Plan of Care (Signed)
Nutrition Education Note  RD consulted for nutrition education regarding a Heart Healthy diet.   Lipid Panel     Component Value Date/Time   CHOL 194 11/27/2019 1332   TRIG 58 11/27/2019 1332   HDL 53 11/27/2019 1332   CHOLHDL 3.7 11/27/2019 1332   VLDL 12 11/27/2019 1332   LDLCALC 129 (H) 11/27/2019 1332   Spoke with pt and wife at bedside. Pt wife reports that they often travel due to their jobs in the [employer]. When they are home, they cook very healthfully- grilled meats, grilled vegetables. Pt is not a picky eater and likes almost all foods except sweet potatoes.   While on the road, pt wife tries to review restaurant menus prior to going. Pt will often eat foods such as steak and salad, burger, or egg biscuits. Pt wife well versed in low sodium diet, as her mother completed an extensive cardiac rehab program late last year. Focus on education was on lower fat meats and healthier choices when eating out.   RD provided "Heart Healthy Nutrition Therapy" handout from the [employer]. Reviewed patient's dietary recall. Provided examples on ways to decrease sodium and fat intake in diet. Discouraged intake of processed foods and use of salt shaker. Encouraged fresh fruits and vegetables as well as whole grain sources of carbohydrates to maximize fiber intake. Teach back method used.  Expect fair to good compliance.  Current diet order is heart healthy, patient is consuming approximately 100% of meals at this time. Labs and medications reviewed. No further nutrition interventions warranted at this time. RD contact information provided. If additional nutrition issues arise, please re-consult RD.  Levada Schilling, RD, LDN, CDCES Registered Dietitian II Certified Diabetes Care and Education Specialist Please refer to [facility] for RD and/or RD on-call/weekend/after hours pager

## 2019-11-30 NOTE — Interval H&P Note (Signed)
History and Physical Interval Note:  11/30/2019 11:46 AM  Luis Chang  has presented today for surgery, with the diagnosis of unstable angina.  The various methods of treatment have been discussed with the patient and family. After consideration of risks, benefits and other options for treatment, the patient has consented to  Procedure(s): LEFT HEART CATH AND CORONARY ANGIOGRAPHY (N/A) as a surgical intervention.  The patient's history has been reviewed, patient examined, no change in status, stable for surgery.  I have reviewed the patient's chart and labs.  Questions were answered to the patient's satisfaction.  Cath Lab Visit (complete for each Cath Lab visit)  Clinical Evaluation Leading to the Procedure:   ACS: Yes.    Non-ACS:    Cristal Deer Tasmin Exantus

## 2019-11-30 NOTE — Progress Notes (Addendum)
 Progress Note  Patient Name: Luis Chang Date of Encounter: 11/30/2019  Primary Cardiologist:New   Subjective   Denies CP  Breathing is OK    Inpatient Medications    Scheduled Meds: . aspirin EC  81 mg Oral Daily  . atorvastatin  80 mg Oral Daily  . metoprolol tartrate  25 mg Oral BID  . sodium chloride flush  3 mL Intravenous Q12H   Continuous Infusions: . sodium chloride    . sodium chloride    . heparin 1,200 Units/hr (11/28/19 1040)   PRN Meds: sodium chloride, acetaminophen, calcium carbonate, melatonin, nicotine, nicotine polacrilex, nitroGLYCERIN, ondansetron (ZOFRAN) IV, sodium chloride flush   Vital Signs    Vitals:   11/29/19 2152 11/30/19 0131 11/30/19 0132 11/30/19 0536  BP: 116/89 130/80  111/88  Pulse: 70     Resp: 16 17  17  Temp: 97.7 F (36.5 C) (!) 97.4 F (36.3 C)  97.6 F (36.4 C)  TempSrc: Oral Oral  Oral  SpO2: 96% 98%  98%  Weight:   80.7 kg   Height:        Intake/Output Summary (Last 24 hours) at 11/30/2019 0710 Last data filed at 11/30/2019 0538 Gross per 24 hour  Intake 1149.07 ml  Output 200 ml  Net 949.07 ml   Filed Weights   11/28/19 0138 11/29/19 0444 11/30/19 0132  Weight: 81.5 kg 79.9 kg 80.7 kg    Telemetry    SR  - Personally Reviewed  ECG    none - Personally Reviewed  Physical Exam   GEN: No acute distress.   Neck: No JVD Cardiac: RRR  No murmurs  Respiratory: Clear to auscultation bilaterally. GI: Soft, nontender, non-distended  MS: No edema; No deformity. Neuro:  Nonfocal  Psych: Normal affect   Labs    Chemistry Recent Labs  Lab 11/27/19 0939 11/27/19 2123 11/28/19 0649  NA 138  --  138  K 4.4  --  4.2  CL 102  --  109  CO2 25  --  20*  GLUCOSE 102*  --  101*  BUN 12  --  13  CREATININE 1.00 1.00 0.95  CALCIUM 9.2  --  9.2  GFRNONAA >60 >60 >60  GFRAA >60 >60 >60  ANIONGAP 11  --  9     Hematology Recent Labs  Lab 11/28/19 0649 11/29/19 0523 11/30/19 0607  WBC 9.2 8.2 8.4    RBC 4.90 5.22 5.06  HGB 16.0 17.0 16.5  HCT 46.6 49.7 47.3  MCV 95.1 95.2 93.5  MCH 32.7 32.6 32.6  MCHC 34.3 34.2 34.9  RDW 12.2 12.2 12.0  PLT 182 192 182    Cardiac EnzymesNo results for input(s): TROPONINI in the last 168 hours. No results for input(s): TROPIPOC in the last 168 hours.   BNPNo results for input(s): BNP, PROBNP in the last 168 hours.   DDimer No results for input(s): DDIMER in the last 168 hours.   Radiology    ECHOCARDIOGRAM COMPLETE  Result Date: 11/28/2019    ECHOCARDIOGRAM REPORT   Patient Name:   Luis Chang Date of Exam: 11/28/2019 Medical Rec #:  [id number]    Height:       71.0 in Accession #:    [id number]   Weight:       179.6 lb Date of Birth:  07/15/1975    BSA:          2.014 m Patient Age:    43 years       BP:           129/96 mmHg Patient Gender: M            HR:           62 bpm. Exam Location:  Inpatient Procedure: 2D Echo, Cardiac Doppler and Color Doppler Indications:    Elevated troponin  History:        Patient has no prior history of Echocardiogram examinations.                 Signs/Symptoms:Chest Pain.  Sonographer:    Roosvelt Maser RDCS Referring Phys: [id number] Jacqulyne Gladue V Tong Pieczynski IMPRESSIONS  1. Left ventricular ejection fraction, by estimation, is 50 to 55%. The left ventricle has low normal function. The left ventricle has no regional wall motion abnormalities. Left ventricular diastolic parameters were normal.  2. Right ventricular systolic function is normal. The right ventricular size is normal.  3. The mitral valve is myxomatous. No evidence of mitral valve regurgitation. No evidence of mitral stenosis.  4. Bicuspid valve - right and left cusp fusion. . The aortic valve is bicuspid. Aortic valve regurgitation is mild. Mild to moderate aortic valve sclerosis/calcification is present, without any evidence of aortic stenosis.  5. The inferior vena cava is normal in size with greater than 50% respiratory variability, suggesting right atrial pressure of 3 mmHg.  FINDINGS  Left Ventricle: Left ventricular ejection fraction, by estimation, is 50 to 55%. The left ventricle has low normal function. The left ventricle has no regional wall motion abnormalities. The left ventricular internal cavity size was normal in size. There is no left ventricular hypertrophy. Left ventricular diastolic parameters were normal. Right Ventricle: The right ventricular size is normal. No increase in right ventricular wall thickness. Right ventricular systolic function is normal. Left Atrium: Left atrial size was normal in size. Right Atrium: Right atrial size was normal in size. Pericardium: There is no evidence of pericardial effusion. Mitral Valve: The mitral valve is myxomatous. There is mild thickening of the mitral valve leaflet(s). Normal mobility of the mitral valve leaflets. No evidence of mitral valve regurgitation. No evidence of mitral valve stenosis. Tricuspid Valve: The tricuspid valve is normal in structure. Tricuspid valve regurgitation is mild . No evidence of tricuspid stenosis. Aortic Valve: Bicuspid valve - right and left cusp fusion. The aortic valve is bicuspid. . There is moderate thickening and moderate calcification of the aortic valve. Aortic valve regurgitation is mild. Aortic regurgitation PHT measures 740 msec. Mild to moderate aortic valve sclerosis/calcification is present, without any evidence of aortic stenosis. There is moderate thickening of the aortic valve. There is moderate calcification of the aortic valve. Pulmonic Valve: The pulmonic valve was normal in structure. Pulmonic valve regurgitation is not visualized. No evidence of pulmonic stenosis. Aorta: The aortic root is normal in size and structure. Venous: The inferior vena cava is normal in size with greater than 50% respiratory variability, suggesting right atrial pressure of 3 mmHg. IAS/Shunts: No atrial level shunt detected by color flow Doppler.  LEFT VENTRICLE PLAX 2D LVIDd:         4.50 cm      Diastology LVIDs:         3.20 cm     LV e' lateral: 11.60 cm/s LV PW:         0.90 cm LV IVS:        0.80 cm LVOT diam:     1.90 cm LV SV:  44 LV SV Index:   22 LVOT Area:     2.84 cm  LV Volumes (MOD) LV vol d, MOD A2C: 92.7 ml LV vol d, MOD A4C: 82.4 ml LV vol s, MOD A2C: 30.3 ml LV vol s, MOD A4C: 42.3 ml LV SV MOD A2C:     62.4 ml LV SV MOD A4C:     82.4 ml LV SV MOD BP:      51.6 ml RIGHT VENTRICLE RV Basal diam:  3.90 cm RV S prime:     11.40 cm/s TAPSE (M-mode): 2.4 cm LEFT ATRIUM             Index       RIGHT ATRIUM           Index LA diam:        3.60 cm 1.79 cm/m  RA Area:     14.20 cm LA Vol (A2C):   55.4 ml 27.50 ml/m RA Volume:   38.80 ml  19.26 ml/m LA Vol (A4C):   34.5 ml 17.13 ml/m LA Biplane Vol: 43.8 ml 21.74 ml/m  AORTIC VALVE LVOT Vmax:   68.50 cm/s LVOT Vmean:  47.800 cm/s LVOT VTI:    0.154 m AI PHT:      740 msec  AORTA Ao Root diam: 3.50 cm Ao Asc diam:  2.80 cm  SHUNTS Systemic VTI:  0.15 m Systemic Diam: 1.90 cm Candee Furbish MD Electronically signed by Candee Furbish MD Signature Date/Time: 11/28/2019/1:16:25 PM    Final     Cardiac Studies   none  Patient Profile     44 y.o. male admitted with [place]    Assessment & Plan    1. [place] - Peak trop 38   Currently pain free on heparin   Plan for L heart cath  Today  2   AV   Pt with probably functionally bicuspid vs bicusp valve   There is mild calcification of valve   Mild AI   No AS  WIll need to be followed over time     3. Tobacco abuse -Needs to quit    4. HTN -  Bp is good   5  HL  LDL 129   On lipitor now   Cath will define aggressiveness/goals for Rx       For questions or updates, please contact [place] Please consult [email or web address] for contact info under Cardiology/STEMI.   Signed, Dorris Carnes, MD  11/30/2019, 7:10 AM  Patient ID: Luis Chang, male   DOB: [birth year], 45 y.o.   MRN: [id number]

## 2019-11-30 NOTE — Progress Notes (Signed)
Reivewed cath with pt and wife as well as echo.  1.  CAD  Moderate 2 Bicuspid AV with mild AI 3  HTN 4  CP  ? Esophageal spasm   ? Small vessel iischemia in setting of HTN  REcomm  1  Crestor    2 Toprol XL 3 Enteric coated ASA 81 mg  4  NTG SL as needed 5  Can try PPI  6  F/U BP and chol in clinic in [place] 7  F/U echo in about 1 yr to reeval AV 8  Stop smoking   [name] MD

## 2019-11-30 NOTE — Progress Notes (Signed)
0 air in the TR band with a 1hour wait until TR band is removed Discharge instructions complete.

## 2019-11-30 NOTE — Discharge Instructions (Signed)
PLEASE REMEMBER TO BRING ALL OF YOUR MEDICATIONS TO EACH OF YOUR FOLLOW-UP OFFICE VISITS.  PLEASE ATTEND ALL SCHEDULED FOLLOW-UP APPOINTMENTS.   Activity: Increase activity slowly as tolerated. You may shower, but no soaking baths (or swimming) for 1 week. No driving for 2 days. No lifting over 5 lbs for 1 week. Increase R arm activity slowly. You should be at full function in 1 week.   You May Return to Work: in 1 week (if applicable)  Wound Care: You may wash cath site gently with soap and water. Keep cath site clean and dry. If you notice pain, swelling, bleeding or pus at your cath site, please call [phone].    Cardiac Cath Site Care Refer to this sheet in the next few weeks. These instructions provide you with information on caring for yourself after your procedure. Your caregiver may also give you more specific instructions. Your treatment has been planned according to current medical practices, but problems sometimes occur. Call your caregiver if you have any problems or questions after your procedure. HOME CARE INSTRUCTIONS  You may shower 24 hours after the procedure. Remove the bandage (dressing) and gently wash the site with plain soap and water. Gently pat the site dry.   Do not apply powder or lotion to the site.   Do not sit in a bathtub, swimming pool, or whirlpool for 5 to 7 days.   No bending, squatting, or lifting anything over 10 pounds (4.5 kg) as directed by your caregiver.   Inspect the site at least twice daily.   Do not drive home if you are discharged the same day of the procedure. Have someone else drive you.   You may drive 24 hours after the procedure unless otherwise instructed by your caregiver.  What to expect:  Any bruising will usually fade within 1 to 2 weeks.   Blood that collects in the tissue (hematoma) may be painful to the touch. It should usually decrease in size and tenderness within 1 to 2 weeks.  SEEK IMMEDIATE MEDICAL CARE IF:  You  have unusual pain at the site or down the affected limb.   You have redness, warmth, swelling, or pain at the site.   You have drainage (other than a small amount of blood on the dressing).   You have chills.   You have a fever or persistent symptoms for more than 72 hours.   You have a fever and your symptoms suddenly get worse.   Your leg becomes pale, cool, tingly, or numb.   You have heavy bleeding from the site. Hold pressure on the site.  Document Released: 07/14/2010 Document Revised: 05/31/2011 Document Reviewed: 07/14/2010 [facility] Patient Information 2012 [place], [place].

## 2019-11-30 NOTE — Progress Notes (Signed)
ANTICOAGULATION CONSULT NOTE   Pharmacy Consult for heparin dosing Indication: ACS/STEMI  No Known Allergies  Patient Measurements: Height: 5\' 11"  (180.3 cm) Weight: 80.7 kg (178 lb) IBW/kg (Calculated) : 75.3 HEPARIN DW (KG): 82.2  Vital Signs: Temp: 97.5 F (36.4 C) (06/07 0816) Temp Source: Oral (06/07 0816) BP: 122/83 (06/07 0816) Pulse Rate: 70 (06/06 2152)  Labs: Recent Labs    11/27/19 0939 11/27/19 0939 11/27/19 1141 11/27/19 1332 11/27/19 2123 11/27/19 2159 11/28/19 0649 11/28/19 0649 11/29/19 0523 11/30/19 0607  HGB 17.1*   < >  --   --  17.3*   < > 16.0   < > 17.0 16.5  HCT 49.6   < >  --   --  49.5   < > 46.6  --  49.7 47.3  PLT 204   < >  --   --  226   < > 182  --  192 182  HEPARINUNFRC  --   --   --   --   --    < > 0.34  --  0.48 0.39  CREATININE 1.00  --   --   --  1.00  --  0.95  --   --   --   TROPONINIHS 3  --  18* 38*  --   --   --   --   --   --    < > = values in this interval not displayed.    Estimated Creatinine Clearance: 104.6 mL/min (by C-G formula based on SCr of 0.95 mg/dL).   Medical History: Past Medical History:  Diagnosis Date  . HTN (hypertension)   . Sleep walking     Assessment: Pharmacy consulted to dose heparin infusion for this 44 yo male with chest pain and elevated  troponins. Patient wasn't taking any anti-coagulant medications prior to admission.   Heparin level remains therapeutic at 0.39. H&H, platelet stable. No bleeding noted. Cath today   Goal of Therapy:  Heparin level 0.3-0.7 units/ml Monitor platelets by anticoagulation protocol: Yes   Plan:  Continue heparin 1200 units/h Daily heparin level and CBC Monitor for bleeding  F/u Cath results   52, PharmD, BCPS, BCCP Clinical Pharmacist  Please check AMION for all [facility] Pharmacy phone numbers After 10:00 PM, call Main Pharmacy [phone]

## 2019-11-30 NOTE — Progress Notes (Signed)
Patient headed to cath Lab

## 2019-11-30 NOTE — H&P (View-Only) (Signed)
Progress Note  Patient Name: Luis Chang Date of Encounter: 11/30/2019  Primary Cardiologist:New   Subjective   Denies CP  Breathing is OK    Inpatient Medications    Scheduled Meds: . aspirin EC  81 mg Oral Daily  . atorvastatin  80 mg Oral Daily  . metoprolol tartrate  25 mg Oral BID  . sodium chloride flush  3 mL Intravenous Q12H   Continuous Infusions: . sodium chloride    . sodium chloride    . heparin 1,200 Units/hr (11/28/19 1040)   PRN Meds: sodium chloride, acetaminophen, calcium carbonate, melatonin, nicotine, nicotine polacrilex, nitroGLYCERIN, ondansetron (ZOFRAN) IV, sodium chloride flush   Vital Signs    Vitals:   11/29/19 2152 11/30/19 0131 11/30/19 0132 11/30/19 0536  BP: 116/89 130/80  111/88  Pulse: 70     Resp: 16 17  17   Temp: 97.7 F (36.5 C) (!) 97.4 F (36.3 C)  97.6 F (36.4 C)  TempSrc: Oral Oral  Oral  SpO2: 96% 98%  98%  Weight:   80.7 kg   Height:        Intake/Output Summary (Last 24 hours) at 11/30/2019 0710 Last data filed at 11/30/2019 0538 Gross per 24 hour  Intake 1149.07 ml  Output 200 ml  Net 949.07 ml   Filed Weights   11/28/19 0138 11/29/19 0444 11/30/19 0132  Weight: 81.5 kg 79.9 kg 80.7 kg    Telemetry    SR  - Personally Reviewed  ECG    none - Personally Reviewed  Physical Exam   GEN: No acute distress.   Neck: No JVD Cardiac: RRR  No murmurs  Respiratory: Clear to auscultation bilaterally. GI: Soft, nontender, non-distended  MS: No edema; No deformity. Neuro:  Nonfocal  Psych: Normal affect   Labs    Chemistry Recent Labs  Lab 11/27/19 0939 11/27/19 2123 11/28/19 0649  NA 138  --  138  K 4.4  --  4.2  CL 102  --  109  CO2 25  --  20*  GLUCOSE 102*  --  101*  BUN 12  --  13  CREATININE 1.00 1.00 0.95  CALCIUM 9.2  --  9.2  GFRNONAA >60 >60 >60  GFRAA >60 >60 >60  ANIONGAP 11  --  9     Hematology Recent Labs  Lab 11/28/19 0649 11/29/19 0523 11/30/19 0607  WBC 9.2 8.2 8.4    RBC 4.90 5.22 5.06  HGB 16.0 17.0 16.5  HCT 46.6 49.7 47.3  MCV 95.1 95.2 93.5  MCH 32.7 32.6 32.6  MCHC 34.3 34.2 34.9  RDW 12.2 12.2 12.0  PLT 182 192 182    Cardiac EnzymesNo results for input(s): TROPONINI in the last 168 hours. No results for input(s): TROPIPOC in the last 168 hours.   BNPNo results for input(s): BNP, PROBNP in the last 168 hours.   DDimer No results for input(s): DDIMER in the last 168 hours.   Radiology    ECHOCARDIOGRAM COMPLETE  Result Date: 11/28/2019    ECHOCARDIOGRAM REPORT   Patient Name:   BEN HABERMANN Date of Exam: 11/28/2019 Medical Rec #:  [id number]    Height:       71.0 in Accession #:    [id number]   Weight:       179.6 lb Date of Birth:  1976-01-09    BSA:          2.014 m Patient Age:    44 years  BP:           129/96 mmHg Patient Gender: M            HR:           62 bpm. Exam Location:  Inpatient Procedure: 2D Echo, Cardiac Doppler and Color Doppler Indications:    Elevated troponin  History:        Patient has no prior history of Echocardiogram examinations.                 Signs/Symptoms:Chest Pain.  Sonographer:    Roosvelt Maser RDCS Referring Phys: [id number] Nailah Luepke V Hyden Soley IMPRESSIONS  1. Left ventricular ejection fraction, by estimation, is 50 to 55%. The left ventricle has low normal function. The left ventricle has no regional wall motion abnormalities. Left ventricular diastolic parameters were normal.  2. Right ventricular systolic function is normal. The right ventricular size is normal.  3. The mitral valve is myxomatous. No evidence of mitral valve regurgitation. No evidence of mitral stenosis.  4. Bicuspid valve - right and left cusp fusion. . The aortic valve is bicuspid. Aortic valve regurgitation is mild. Mild to moderate aortic valve sclerosis/calcification is present, without any evidence of aortic stenosis.  5. The inferior vena cava is normal in size with greater than 50% respiratory variability, suggesting right atrial pressure of 3 mmHg.  FINDINGS  Left Ventricle: Left ventricular ejection fraction, by estimation, is 50 to 55%. The left ventricle has low normal function. The left ventricle has no regional wall motion abnormalities. The left ventricular internal cavity size was normal in size. There is no left ventricular hypertrophy. Left ventricular diastolic parameters were normal. Right Ventricle: The right ventricular size is normal. No increase in right ventricular wall thickness. Right ventricular systolic function is normal. Left Atrium: Left atrial size was normal in size. Right Atrium: Right atrial size was normal in size. Pericardium: There is no evidence of pericardial effusion. Mitral Valve: The mitral valve is myxomatous. There is mild thickening of the mitral valve leaflet(s). Normal mobility of the mitral valve leaflets. No evidence of mitral valve regurgitation. No evidence of mitral valve stenosis. Tricuspid Valve: The tricuspid valve is normal in structure. Tricuspid valve regurgitation is mild . No evidence of tricuspid stenosis. Aortic Valve: Bicuspid valve - right and left cusp fusion. The aortic valve is bicuspid. . There is moderate thickening and moderate calcification of the aortic valve. Aortic valve regurgitation is mild. Aortic regurgitation PHT measures 740 msec. Mild to moderate aortic valve sclerosis/calcification is present, without any evidence of aortic stenosis. There is moderate thickening of the aortic valve. There is moderate calcification of the aortic valve. Pulmonic Valve: The pulmonic valve was normal in structure. Pulmonic valve regurgitation is not visualized. No evidence of pulmonic stenosis. Aorta: The aortic root is normal in size and structure. Venous: The inferior vena cava is normal in size with greater than 50% respiratory variability, suggesting right atrial pressure of 3 mmHg. IAS/Shunts: No atrial level shunt detected by color flow Doppler.  LEFT VENTRICLE PLAX 2D LVIDd:         4.50 cm      Diastology LVIDs:         3.20 cm     LV e' lateral: 11.60 cm/s LV PW:         0.90 cm LV IVS:        0.80 cm LVOT diam:     1.90 cm LV SV:  44 LV SV Index:   22 LVOT Area:     2.84 cm  LV Volumes (MOD) LV vol d, MOD A2C: 92.7 ml LV vol d, MOD A4C: 82.4 ml LV vol s, MOD A2C: 30.3 ml LV vol s, MOD A4C: 42.3 ml LV SV MOD A2C:     62.4 ml LV SV MOD A4C:     82.4 ml LV SV MOD BP:      51.6 ml RIGHT VENTRICLE RV Basal diam:  3.90 cm RV S prime:     11.40 cm/s TAPSE (M-mode): 2.4 cm LEFT ATRIUM             Index       RIGHT ATRIUM           Index LA diam:        3.60 cm 1.79 cm/m  RA Area:     14.20 cm LA Vol (A2C):   55.4 ml 27.50 ml/m RA Volume:   38.80 ml  19.26 ml/m LA Vol (A4C):   34.5 ml 17.13 ml/m LA Biplane Vol: 43.8 ml 21.74 ml/m  AORTIC VALVE LVOT Vmax:   68.50 cm/s LVOT Vmean:  47.800 cm/s LVOT VTI:    0.154 m AI PHT:      740 msec  AORTA Ao Root diam: 3.50 cm Ao Asc diam:  2.80 cm  SHUNTS Systemic VTI:  0.15 m Systemic Diam: 1.90 cm Candee Furbish MD Electronically signed by Candee Furbish MD Signature Date/Time: 11/28/2019/1:16:25 PM    Final     Cardiac Studies   none  Patient Profile     44 y.o. male admitted with [place]    Assessment & Plan    1. [place] - Peak trop 38   Currently pain free on heparin   Plan for L heart cath  Today  2   AV   Pt with probably functionally bicuspid vs bicusp valve   There is mild calcification of valve   Mild AI   No AS  WIll need to be followed over time     3. Tobacco abuse -Needs to quit    4. HTN -  Bp is good   5  HL  LDL 129   On lipitor now   Cath will define aggressiveness/goals for Rx       For questions or updates, please contact [place] Please consult [email or web address] for contact info under Cardiology/STEMI.   Signed, Dorris Carnes, MD  11/30/2019, 7:10 AM  Patient ID: Deloris Ping, male   DOB: [birth year], 45 y.o.   MRN: [id number]

## 2019-12-11 ENCOUNTER — Other Ambulatory Visit: Payer: Self-pay

## 2019-12-11 ENCOUNTER — Encounter: Payer: Self-pay | Admitting: Physician Assistant

## 2019-12-11 ENCOUNTER — Ambulatory Visit: Payer: BC Managed Care – PPO | Admitting: Physician Assistant

## 2019-12-11 VITALS — BP 120/80 | HR 85 | Ht 71.0 in | Wt 183.0 lb

## 2019-12-11 DIAGNOSIS — E785 Hyperlipidemia, unspecified: Secondary | ICD-10-CM | POA: Diagnosis not present

## 2019-12-11 DIAGNOSIS — Q231 Congenital insufficiency of aortic valve: Secondary | ICD-10-CM | POA: Diagnosis not present

## 2019-12-11 DIAGNOSIS — I2510 Atherosclerotic heart disease of native coronary artery without angina pectoris: Secondary | ICD-10-CM | POA: Diagnosis not present

## 2019-12-11 DIAGNOSIS — I10 Essential (primary) hypertension: Secondary | ICD-10-CM

## 2019-12-11 NOTE — Addendum Note (Signed)
Addended by: Darrol Jump on: 12/11/2019 07:29 PM   Modules accepted: Level of Service

## 2019-12-11 NOTE — Patient Instructions (Signed)
Medication Instructions:  Continue all current medications.  Labwork: none  Testing/Procedures: none  Follow-Up: 3 months   Any Other Special Instructions Will Be Listed Below (If Applicable).  If you need a refill on your cardiac medications before your next appointment, please call your pharmacy.  

## 2019-12-11 NOTE — Progress Notes (Signed)
Cardiology Office Note   Date:  12/11/2019   ID:  Luis Chang, DOB [birth year], MRN [id number]  PCP:  Derinda Late, MD Cardiologist:  Dorris Carnes, MD 11/30/2019 in-hospital Electrphysiologist: None Rosaria Ferries, PA-C    History of Present Illness: Luis Chang is a 44 y.o. male with a history of HTN, HLD, sleep walking.  Admitted 06/04-06/07 w/ CP, mod CAD at cath, bicuspid AoV on echo  Luis Chang presents for cardiology follow up.  His BP was up/down at first on the new meds. It has stabilized.   Has not used the nitro since d/c. He has one episode of CP, pressure, but his BP was high at the time. The CP resolved w/ rest. His BP improved once the meds kicked in.   No LE edema, no orthopnea or PND. No DOE.   No presyncope or syncope. Not light-headed or dizzy. No palpitations.  Has been active around the house and farm, no issues w/ this.  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  Has not yet quit smoking.   Past Medical History:  Diagnosis Date  . Bicuspid aortic valve 11/28/2019   R/L cusp fusion  . CAD (coronary artery disease) 11/30/2019   50% Diag2 & CFX, med rx  . Dyslipidemia (high LDL; low HDL) 11/30/2019  . HTN (hypertension)   . Sleep walking     Past Surgical History:  Procedure Laterality Date  . FOREIGN BODY REMOVAL  2007   RIGHT HAND  . LEFT HEART CATH AND CORONARY ANGIOGRAPHY N/A 11/30/2019   Procedure: LEFT HEART CATH AND CORONARY ANGIOGRAPHY;  Surgeon: Nelva Bush, MD;  Location: [facility];  Service: Cardiovascular;  Laterality: N/A;  . WISDOM TOOTH EXTRACTION      Current Outpatient Medications  Medication Sig Dispense Refill  . acetaminophen (TYLENOL) 500 MG tablet Take 500 mg by mouth daily as needed for headache.    Marland Kitchen aspirin EC 81 MG EC tablet Take 1 tablet (81 mg total) by mouth daily.    . calcium carbonate (TUMS - DOSED IN MG ELEMENTAL CALCIUM) 500 MG chewable tablet Chew 1 tablet (200 mg of  elemental calcium total) by mouth 3 (three) times daily as needed for indigestion or heartburn.    . Ibuprofen (ADVIL) 200 MG CAPS Take 1 capsule by mouth daily as needed (headache).    . melatonin 3 MG TABS tablet Take 1 tablet (3 mg total) by mouth at bedtime as needed (insomnia).  0  . metoprolol succinate (TOPROL XL) 50 MG 24 hr tablet Take 1 tablet (50 mg total) by mouth daily. Take with or immediately following a meal. 30 tablet 11  . nitroGLYCERIN (NITROSTAT) 0.4 MG SL tablet Place 1 tablet (0.4 mg total) under the tongue every 5 (five) minutes x 3 doses as needed for chest pain. 25 tablet 12  . rosuvastatin (CRESTOR) 20 MG tablet Take 0.5 tablets (10 mg total) by mouth daily. 30 tablet 11   No current facility-administered medications for this visit.    Allergies:   Patient has no known allergies.    Social History:  The patient  reports that he has been smoking cigarettes. He has been smoking about 2.00 packs per day. He has never used smokeless tobacco. He reports current alcohol use. He reports that he does not use drugs.   Family History:  The patient's family history includes Arthritis in an other family member; High blood pressure in his father  and sister.  He indicated that his mother is alive. He indicated that his father is alive. He indicated that the status of his sister is unknown. He indicated that his brother is alive. He indicated that the status of his other is unknown.   ROS:  Please see the history of present illness. All other systems are reviewed and negative.    PHYSICAL EXAM: VS:  BP 120/80   Pulse 85   Ht 5\' 11"  (1.803 m)   Wt 183 lb (83 kg)   SpO2 96%   BMI 25.52 kg/m  , BMI Body mass index is 25.52 kg/m. GEN: Well nourished, well developed, male in no acute distress HEENT: normal for age  Neck: no JVD, no carotid bruit, no masses Cardiac: RRR; faint murmur, no rubs, or gallops Respiratory:  clear to auscultation bilaterally, normal work of  breathing, few scattered rales GI: soft, nontender, nondistended, + BS MS: no deformity or atrophy; no edema; distal pulses are 2+ in all 4 extremities  Skin: warm and dry, no rash Neuro:  Strength and sensation are intact Psych: euthymic mood, full affect   EKG:  EKG is not ordered today.  CARDIAC CATH: 11/30/2019 Conclusion: 1. Moderate two-vessel coronary artery disease with 50% lesions involving D2 and mid LCx. No significant stenosis noted in the LAD or RCA. 2. Normal left ventricular filling pressure.  Recommendations: 1. Aggressive medical therapy and risk factor modifications to prevent progression of disease. Full results below  ECHO: 11/28/2019 1. Left ventricular ejection fraction, by estimation, is 50 to 55%. The left ventricle has low normal function. The left ventricle has no regional wall motion abnormalities. Left ventricular diastolic parameters were normal.  2. Right ventricular systolic function is normal. The right ventricular size is normal.  3. The mitral valve is myxomatous. No evidence of mitral valve regurgitation. No evidence of mitral stenosis.  4. Bicuspid valve - right and left cusp fusion. . The aortic valve is bicuspid. Aortic valve regurgitation is mild. Mild to moderate aortic valve sclerosis/calcification is present, without any evidence of aortic stenosis.  5. The inferior vena cava is normal in size with greater than 50% respiratory variability, suggesting right atrial pressure of 3 mmHg.    Recent Labs: 11/28/2019: BUN 13; Creatinine, Ser 0.95; Potassium 4.2; Sodium 138 11/30/2019: ALT 43; Hemoglobin 16.5; Platelets 182  CBC    Component Value Date/Time   WBC 8.4 11/30/2019 0607   RBC 5.06 11/30/2019 0607   HGB 16.5 11/30/2019 0607   HCT 47.3 11/30/2019 0607   PLT 182 11/30/2019 0607   MCV 93.5 11/30/2019 0607   MCH 32.6 11/30/2019 0607   MCHC 34.9 11/30/2019 0607   RDW 12.0 11/30/2019 0607   LYMPHSABS 3.0 10/07/2016 0127   MONOABS  0.8 10/07/2016 0127   EOSABS 0.3 10/07/2016 0127   BASOSABS 0.1 10/07/2016 0127   CMP Latest Ref Rng & Units 11/30/2019 11/28/2019 11/27/2019  Glucose 70 - 99 mg/dL - 01/27/2020) -  BUN 6 - 20 mg/dL - 13 -  Creatinine 665(L - 1.24 mg/dL - 9.35 7.01  Sodium 7.79 - 145 mmol/L - 138 -  Potassium 3.5 - 5.1 mmol/L - 4.2 -  Chloride 98 - 111 mmol/L - 109 -  CO2 22 - 32 mmol/L - 20(L) -  Calcium 8.9 - 10.3 mg/dL - 9.2 -  Total Protein 6.5 - 8.1 g/dL 6.0(L) - -  Total Bilirubin 0.3 - 1.2 mg/dL 0.6 - -  Alkaline Phos 38 - 126 U/L 62 - -  AST 15 - 41 U/L 41 - -  ALT 0 - 44 U/L 43 - -     Lipid Panel Lab Results  Component Value Date   CHOL 194 11/27/2019   HDL 53 11/27/2019   LDLCALC 129 (H) 11/27/2019   TRIG 58 11/27/2019   CHOLHDL 3.7 11/27/2019      Wt Readings from Last 3 Encounters:  12/11/19 183 lb (83 kg)  11/30/19 178 lb (80.7 kg)  06/15/19 178 lb (80.7 kg)     Other studies Reviewed: Additional studies/ records that were reviewed today include: Office notes, hospital records and testing.  ASSESSMENT AND PLAN:  1.  CAD: -He is on aspirin 81 mg, Toprol-XL 50 mg and Crestor 10 mg daily. -He has nitroglycerin to use as needed -Continue current therapy, no testing needed  2.  Hypertension: -According to his wife, his blood pressure was up/down at first after discharge. -However, his blood pressure has settled out on the metoprolol and he is tolerating it well. - His blood pressure is well controlled today -No med changes  3.  Hyperlipidemia -LDL was 129 when checked during his hospitalization, he is on Crestor 10 mg daily -Continue this, recheck in 3 months  4.  Bicuspid aortic valve -He had mild sclerosis but no stenosis and right/left cusps were fused on recent echo -He is not having any symptoms associated with this.  He is encouraged to contact us if he has any problems with being lightheaded or dizzy, syncope or presyncope or dyspnea on exertion -No further evaluation  indicated at this time  5.  Tobacco use -He is encouraged to quit tobacco, he will think about it   Current medicines are reviewed at length with the patient today.  The patient does not have concerns regarding medicines.  The following changes have been made:  no change  Labs/ tests ordered today include:  No orders of the defined types were placed in this encounter.    Disposition:   FU with [name], MD 3 months  Signed, Theodore Demark, PA-C  12/11/2019 3:07 PM    [facility] Phone: [phone]; Fax: [phone]

## 2020-01-08 ENCOUNTER — Telehealth: Payer: Self-pay | Admitting: Internal Medicine

## 2020-01-08 NOTE — Telephone Encounter (Signed)
New message    Patient is requesting a medical note for his job stating that he needs to wear shorts instead of Jeans on the job, it is to hot for him to wear pants.    Can you email it to : [email or web address]

## 2020-01-14 ENCOUNTER — Encounter: Payer: Self-pay | Admitting: Physician Assistant

## 2020-01-15 ENCOUNTER — Encounter: Payer: Self-pay | Admitting: Physician Assistant

## 2020-02-01 NOTE — Progress Notes (Signed)
Cardiology Office Note  Date: 02/02/2020   ID: Luis Chang, Luis Chang 1975/09/11, MRN [id number]  PCP:  [name], MD  Cardiologist:  [name], MD Electrophysiologist:  None   Chief Complaint:   History of Present Illness: Luis Chang is a 44 y.o. male with a history of hypertension, chest pain.  Presented to emergency room on November 27, 2019 with chest pain.  He was admitted for further evaluation and treatment.  Cardiac enzymes negative for MI.  He was pain-free on aspirin, heparin, beta-blocker and statin.  Taken to Cath Lab on 11/30/2019 showing moderate nonobstructive disease.  Medical therapy was recommended.  He was started on Crestor 20 mg daily.  Started on beta-blocker  Started on nicotine patch to assist in smoking cessation.  Right radial catheter access site was without ecchymosis or hematoma.  He was considered stable for discharge.  Echo showed bicuspid aortic valve with mild AI.  Past Medical History:  Diagnosis Date  . Bicuspid aortic valve 11/28/2019   R/L cusp fusion  . CAD (coronary artery disease) 11/30/2019   50% Diag2 & CFX, med rx  . Dyslipidemia (high LDL; low HDL) 11/30/2019  . HTN (hypertension)   . Sleep walking     Past Surgical History:  Procedure Laterality Date  . FOREIGN BODY REMOVAL  2007   RIGHT HAND  . LEFT HEART CATH AND CORONARY ANGIOGRAPHY N/A 11/30/2019   Procedure: LEFT HEART CATH AND CORONARY ANGIOGRAPHY;  Surgeon: [name], MD;  Location: [facility];  Service: Cardiovascular;  Laterality: N/A;  . WISDOM TOOTH EXTRACTION      Current Outpatient Medications  Medication Sig Dispense Refill  . acetaminophen (TYLENOL) 500 MG tablet Take 500 mg by mouth daily as needed for headache.    Marland Kitchen aspirin EC 81 MG EC tablet Take 1 tablet (81 mg total) by mouth daily.    . calcium carbonate (TUMS - DOSED IN MG ELEMENTAL CALCIUM) 500 MG chewable tablet Chew 1 tablet (200 mg of elemental calcium total) by mouth 3 (three) times daily as  needed for indigestion or heartburn.    . Ibuprofen (ADVIL) 200 MG CAPS Take 1 capsule by mouth daily as needed (headache).    . melatonin 3 MG TABS tablet Take 1 tablet (3 mg total) by mouth at bedtime as needed (insomnia).  0  . nitroGLYCERIN (NITROSTAT) 0.4 MG SL tablet Place 1 tablet (0.4 mg total) under the tongue every 5 (five) minutes x 3 doses as needed for chest pain. 25 tablet 12  . rosuvastatin (CRESTOR) 20 MG tablet Take 0.5 tablets (10 mg total) by mouth daily. 30 tablet 11  . amLODipine (NORVASC) 5 MG tablet Take 1 tablet (5 mg total) by mouth daily. 90 tablet 2  . metoprolol succinate (TOPROL XL) 25 MG 24 hr tablet Take 1 tablet (25 mg total) by mouth daily. 90 tablet 2  . pantoprazole (PROTONIX) 20 MG tablet Take 1 tablet (20 mg total) by mouth daily. 90 tablet 2   No current facility-administered medications for this visit.   Allergies:  Patient has no known allergies.   Social History: The patient  reports that he has been smoking cigarettes. He has been smoking about 2.00 packs per day. He has never used smokeless tobacco. He reports current alcohol use. He reports that he does not use drugs.   Family History: The patient's family history includes Arthritis in an other family member; High blood pressure in his father and sister.  ROS:  Please see the history of present illness. Otherwise, complete review of systems is positive for none.  All other systems are reviewed and negative.   Physical Exam: VS:  BP 140/88   Pulse 74   Ht 5\' 11"  (1.803 m)   Wt 185 lb (83.9 kg)   SpO2 98%   BMI 25.80 kg/m , BMI Body mass index is 25.8 kg/m.  Wt Readings from Last 3 Encounters:  02/02/20 185 lb (83.9 kg)  12/11/19 183 lb (83 kg)  11/30/19 178 lb (80.7 kg)    General: Patient appears comfortable at rest. Neck: Supple, no elevated JVP or carotid bruits, no thyromegaly. Lungs: Clear to auscultation, nonlabored breathing at rest. Cardiac: Regular rate and rhythm, no S3 or  significant systolic murmur, no pericardial rub. Extremities: No pitting edema, distal pulses 2+. Skin: Warm and dry. Musculoskeletal: No kyphosis. Neuropsychiatric: Alert and oriented x3, affect grossly appropriate.  ECG:  EKG November 27, 2019 normal sinus rhythm rate of 76, nonspecific ST abnormality.  Recent Labwork: 11/28/2019: BUN 13; Creatinine, Ser 0.95; Potassium 4.2; Sodium 138 11/30/2019: ALT 43; AST 41; Hemoglobin 16.5; Platelets 182     Component Value Date/Time   CHOL 194 11/27/2019 1332   TRIG 58 11/27/2019 1332   HDL 53 11/27/2019 1332   CHOLHDL 3.7 11/27/2019 1332   VLDL 12 11/27/2019 1332   LDLCALC 129 (H) 11/27/2019 1332    Other Studies Reviewed Today: CARDIAC CATH: 11/30/2019 Conclusion: 1. Moderate two-vessel coronary artery disease with 50% lesions involving D2 and mid LCx. No significant stenosis noted in the LAD or RCA. 2. Normal left ventricular filling pressure.  Recommendations: 1. Aggressive medical therapy and risk factor modifications to prevent progression of disease. Full results below  ECHO: 11/28/2019 1. Left ventricular ejection fraction, by estimation, is 50 to 55%. The left ventricle has low normal function. The left ventricle has no regional wall motion abnormalities. Left ventricular diastolic parameters were normal.  2. Right ventricular systolic function is normal. The right ventricular size is normal.  3. The mitral valve is myxomatous. No evidence of mitral valve regurgitation. No evidence of mitral stenosis.  4. Bicuspid valve - right and left cusp fusion. . The aortic valve is bicuspid. Aortic valve regurgitation is mild. Mild to moderate aortic valve sclerosis/calcification is present, without any evidence of aortic stenosis.  5. The inferior vena cava is normal in size with greater than 50% respiratory variability, suggesting right atrial pressure of 3 mmHg.  Diagnostic Dominance: Right  Intervention     Assessment and  Plan:  1. CAD in native artery   2. Bicuspid aortic valve   3. Smoker   4. Essential hypertension   5. Mixed hyperlipidemia   6. Gastroesophageal reflux disease, unspecified whether esophagitis present    1. CAD in native artery Recent episode of chest pain with subsequent cardiac catheterization.Demonstrating two-vessel coronary artery disease with 50% lesions involving D2 and mid circumflex.  No significant stenosis in LAD or RCA.  Aggressive medical therapy recommended.  Patient states since cardiac catheterization he has had 2 subsequent episodes of chest pain which resolved.  Continue aspirin 81 mg daily.  Sublingual nitroglycerin as needed for chest pain.  2. Bicuspid aortic valve Recent echo 11/28/2019 showed EF 5055%.  Patient has bicuspid aortic valve.  Mild to moderate aortic valve sclerosis, aortic regurgitation mild, no evidence of aortic stenosis.  3. Smoker Patient has a history of long-term smoking and continues to smoke.  Heavily emphasized the need to stop  smoking as this contributes to heart disease via increased plaque deposition in coronary arteries.  Discussed risk for lung disease, lung cancer if continuing to smoke  4. Essential hypertension Patient states he does feel significantly tired since starting beta-blocker.  Decrease Toprol-XL to 25 mg daily.  Add amlodipine 5 mg p.o. daily for better blood pressure control.  5. Mixed hyperlipidemia Recent lipid panel on 11/27/2019: TC 194, TG 58, HDL 53, LDL 129.  Continue Crestor 10 mg daily.  6.  Reflux symptoms Patient states he is having reflux symptoms.  Start Protonix 20 mg daily.  Medication Adjustments/Labs and Tests Ordered: Current medicines are reviewed at length with the patient today.  Concerns regarding medicines are outlined above.   Disposition: Follow-up with Dr. Tenny Craw as scheduled follow-up on October 11 at 9:40 AM.  Signed, Rennis Harding, NP 02/02/2020 2:03 PM    [facility] at  [facility] [address] [place], [place], [place] [address] Phone: [phone]; Fax: [phone]

## 2020-02-02 ENCOUNTER — Encounter: Payer: Self-pay | Admitting: Family Medicine

## 2020-02-02 ENCOUNTER — Encounter: Payer: Self-pay | Admitting: *Deleted

## 2020-02-02 ENCOUNTER — Ambulatory Visit: Payer: BC Managed Care – PPO | Admitting: Family Medicine

## 2020-02-02 VITALS — BP 140/88 | HR 74 | Ht 71.0 in | Wt 185.0 lb

## 2020-02-02 DIAGNOSIS — Q231 Congenital insufficiency of aortic valve: Secondary | ICD-10-CM

## 2020-02-02 DIAGNOSIS — E782 Mixed hyperlipidemia: Secondary | ICD-10-CM

## 2020-02-02 DIAGNOSIS — F17200 Nicotine dependence, unspecified, uncomplicated: Secondary | ICD-10-CM | POA: Diagnosis not present

## 2020-02-02 DIAGNOSIS — I10 Essential (primary) hypertension: Secondary | ICD-10-CM | POA: Diagnosis not present

## 2020-02-02 DIAGNOSIS — I2510 Atherosclerotic heart disease of native coronary artery without angina pectoris: Secondary | ICD-10-CM

## 2020-02-02 DIAGNOSIS — K219 Gastro-esophageal reflux disease without esophagitis: Secondary | ICD-10-CM

## 2020-02-02 MED ORDER — PANTOPRAZOLE SODIUM 20 MG PO TBEC
20.0000 mg | DELAYED_RELEASE_TABLET | Freq: Every day | ORAL | 2 refills | Status: DC
Start: 2020-02-02 — End: 2020-04-04

## 2020-02-02 MED ORDER — METOPROLOL SUCCINATE ER 25 MG PO TB24
25.0000 mg | ORAL_TABLET | Freq: Every day | ORAL | 2 refills | Status: DC
Start: 2020-02-02 — End: 2020-04-04

## 2020-02-02 MED ORDER — AMLODIPINE BESYLATE 5 MG PO TABS
5.0000 mg | ORAL_TABLET | Freq: Every day | ORAL | 2 refills | Status: DC
Start: 2020-02-02 — End: 2020-08-10

## 2020-02-02 NOTE — Progress Notes (Signed)
Approval notification faxed to pharmacy.

## 2020-02-02 NOTE — Patient Instructions (Addendum)
Medication Instructions:   Your physician has recommended you make the following change in your medication:   Decrease metoprolol succinate to 25 mg daily  Start amlodipine 5 mg daily  Start protonix 20 mg daily  Continue other medications the same  Labwork:  NONE  Testing/Procedures:  NONE  Follow-Up:  Your physician recommends that you schedule a follow-up appointment in: as planned with Dr. Tenny Craw.  Any Other Special Instructions Will Be Listed Below (If Applicable).  If you need a refill on your cardiac medications before your next appointment, please call your pharmacy.

## 2020-04-04 ENCOUNTER — Other Ambulatory Visit (HOSPITAL_COMMUNITY)
Admission: RE | Admit: 2020-04-04 | Discharge: 2020-04-04 | Disposition: A | Discharge status: Home / Self Care | Payer: BC Managed Care – PPO | Source: Ambulatory Visit | Attending: Internal Medicine | Admitting: Internal Medicine

## 2020-04-04 ENCOUNTER — Encounter: Payer: Self-pay | Admitting: Internal Medicine

## 2020-04-04 ENCOUNTER — Ambulatory Visit: Payer: BC Managed Care – PPO | Admitting: Internal Medicine

## 2020-04-04 ENCOUNTER — Other Ambulatory Visit: Payer: Self-pay

## 2020-04-04 VITALS — BP 118/76 | HR 74 | Ht 71.0 in | Wt 188.0 lb

## 2020-04-04 DIAGNOSIS — I2510 Atherosclerotic heart disease of native coronary artery without angina pectoris: Secondary | ICD-10-CM | POA: Diagnosis not present

## 2020-04-04 DIAGNOSIS — E782 Mixed hyperlipidemia: Secondary | ICD-10-CM | POA: Insufficient documentation

## 2020-04-04 DIAGNOSIS — I350 Nonrheumatic aortic (valve) stenosis: Secondary | ICD-10-CM

## 2020-04-04 LAB — HEPATIC FUNCTION PANEL
ALT: 91 U/L — ABNORMAL HIGH (ref 0–44)
AST: 54 U/L — ABNORMAL HIGH (ref 15–41)
Albumin: 4.5 g/dL (ref 3.5–5.0)
Alkaline Phosphatase: 72 U/L (ref 38–126)
Bilirubin, Direct: 0.2 mg/dL (ref 0.0–0.2)
Indirect Bilirubin: 0.8 mg/dL (ref 0.3–0.9)
Total Bilirubin: 1 mg/dL (ref 0.3–1.2)
Total Protein: 7.8 g/dL (ref 6.5–8.1)

## 2020-04-04 LAB — LIPID PANEL
Cholesterol: 171 mg/dL (ref 0–200)
HDL: 58 mg/dL (ref >40)
LDL Cholesterol: 99 mg/dL (ref 0–99)
Total CHOL/HDL Ratio: 2.9 RATIO
Triglycerides: 72 mg/dL (ref <150)
VLDL: 14 mg/dL (ref 0–40)

## 2020-04-04 MED ORDER — METOPROLOL SUCCINATE ER 25 MG PO TB24: 12.5000 mg | ORAL_TABLET | Freq: Every day | ORAL | 3 refills | Status: DC

## 2020-04-04 MED ORDER — SILDENAFIL CITRATE 50 MG PO TABS
50.0000 mg | ORAL_TABLET | Freq: Every day | ORAL | 0 refills | Status: DC | PRN
Start: 2020-04-04 — End: 2020-12-15

## 2020-04-04 MED ORDER — PANTOPRAZOLE SODIUM 20 MG PO TBEC: 20.0000 mg | DELAYED_RELEASE_TABLET | ORAL | 3 refills | Status: DC

## 2020-04-04 NOTE — Progress Notes (Signed)
Cardiology Office Note   Date:  04/04/2020   ID:  Luis Chang, DOB [birth year], MRN [id number]  PCP:  [name], MD Cardiologist:  [name], MD 11/30/2019 in-hospital Electrphysiologist: None [name], MD    History of Present Illness: Luis Chang is a 44 y.o. male with a history of HTN, HLD, sleep walking.  Admitted 06/04-06/12/2019  w/ CP.  There was trivial elevation of trop   38 peak   Went on to have cardiac catheterization  Which showed 50% D2; 50% mLCx.  Echo also done which showed  bicuspid AoV with no AS, mild AI    He was seen by A Vincenza Hews in Aug   Complained of some fatigue  Toprol pulled and amlodipine added.   Energy improved  He denies CP   Says the protonix has helped   Has not taken NTG  Remains active   No SOB   Continues to smoke  Down to 2.5 packs per day  Complains of some erectile dysfunciton     Past Medical History:  Diagnosis Date  . Bicuspid aortic valve 11/28/2019   R/L cusp fusion  . CAD (coronary artery disease) 11/30/2019   50% Diag2 & CFX, med rx  . Dyslipidemia (high LDL; low HDL) 11/30/2019  . HTN (hypertension)   . Sleep walking     Past Surgical History:  Procedure Laterality Date  . FOREIGN BODY REMOVAL  2007   RIGHT HAND  . LEFT HEART CATH AND CORONARY ANGIOGRAPHY N/A 11/30/2019   Procedure: LEFT HEART CATH AND CORONARY ANGIOGRAPHY;  Surgeon: [name], MD;  Location: [facility];  Service: Cardiovascular;  Laterality: N/A;  . WISDOM TOOTH EXTRACTION      Current Outpatient Medications  Medication Sig Dispense Refill  . acetaminophen (TYLENOL) 500 MG tablet Take 500 mg by mouth daily as needed for headache.    Marland Kitchen amLODipine (NORVASC) 5 MG tablet Take 1 tablet (5 mg total) by mouth daily. 90 tablet 2  . aspirin EC 81 MG EC tablet Take 1 tablet (81 mg total) by mouth daily.    . Ibuprofen (ADVIL) 200 MG CAPS Take 1 capsule by mouth daily as needed (headache).    . metoprolol succinate (TOPROL XL) 25 MG 24 hr  tablet Take 1 tablet (25 mg total) by mouth daily. 90 tablet 2  . nitroGLYCERIN (NITROSTAT) 0.4 MG SL tablet Place 1 tablet (0.4 mg total) under the tongue every 5 (five) minutes x 3 doses as needed for chest pain. 25 tablet 12  . pantoprazole (PROTONIX) 20 MG tablet Take 1 tablet (20 mg total) by mouth daily. 90 tablet 2  . rosuvastatin (CRESTOR) 20 MG tablet Take 0.5 tablets (10 mg total) by mouth daily. 30 tablet 11   No current facility-administered medications for this visit.    Allergies:   Patient has no known allergies.    Social History:  The patient  reports that he has been smoking cigarettes. He has been smoking about 2.00 packs per day. He has never used smokeless tobacco. He reports current alcohol use. He reports that he does not use drugs.   Family History:  The patient's family history includes Arthritis in an other family member; High blood pressure in his father and sister.  He indicated that his mother is alive. He indicated that his father is alive. He indicated that the status of his sister is unknown. He indicated that his brother is alive. He indicated that the status  of his other is unknown.   ROS:  Please see the history of present illness. All other systems are reviewed and negative.    PHYSICAL EXAM: VS:  BP 118/76   Pulse 74   Ht 5\' 11"  (1.803 m)   Wt 188 lb (85.3 kg)   SpO2 97%   BMI 26.22 kg/m  , BMI Body mass index is 26.22 kg/m. GEN: Well nourished, well developed, male in no acute distress HEENT: normal   Neck: no JVD, no carotid buit Cardiac: RRR; Gr I/VI diastolic murmur LSB Respiratory:  clear to auscultation bilaterally,  GI: soft, nontender, nondistended, + BS MS: no deformity or atrophy; no edema; distal pulses are 2+ in all 4 extremities  Skin: warm and dry, no rash Neuro:  Strength and sensation are intact Psych: euthymic mood, full affect   EKG:  EKG is not ordered today.  CARDIAC CATH: 11/30/2019 Conclusion: 1. Moderate  two-vessel coronary artery disease with 50% lesions involving D2 and mid LCx. No significant stenosis noted in the LAD or RCA. 2. Normal left ventricular filling pressure.  Recommendations: 1. Aggressive medical therapy and risk factor modifications to prevent progression of disease. Full results below  ECHO: 11/28/2019 1. Left ventricular ejection fraction, by estimation, is 50 to 55%. The left ventricle has low normal function. The left ventricle has no regional wall motion abnormalities. Left ventricular diastolic parameters were normal.  2. Right ventricular systolic function is normal. The right ventricular size is normal.  3. The mitral valve is myxomatous. No evidence of mitral valve regurgitation. No evidence of mitral stenosis.  4. Bicuspid valve - right and left cusp fusion. . The aortic valve is bicuspid. Aortic valve regurgitation is mild. Mild to moderate aortic valve sclerosis/calcification is present, without any evidence of aortic stenosis.  5. The inferior vena cava is normal in size with greater than 50% respiratory variability, suggesting right atrial pressure of 3 mmHg.    Recent Labs: 11/28/2019: BUN 13; Creatinine, Ser 0.95; Potassium 4.2; Sodium 138 11/30/2019: ALT 43; Hemoglobin 16.5; Platelets 182  CBC    Component Value Date/Time   WBC 8.4 11/30/2019 0607   RBC 5.06 11/30/2019 0607   HGB 16.5 11/30/2019 0607   HCT 47.3 11/30/2019 0607   PLT 182 11/30/2019 0607   MCV 93.5 11/30/2019 0607   MCH 32.6 11/30/2019 0607   MCHC 34.9 11/30/2019 0607   RDW 12.0 11/30/2019 0607   LYMPHSABS 3.0 10/07/2016 0127   MONOABS 0.8 10/07/2016 0127   EOSABS 0.3 10/07/2016 0127   BASOSABS 0.1 10/07/2016 0127   CMP Latest Ref Rng & Units 11/30/2019 11/28/2019 11/27/2019  Glucose 70 - 99 mg/dL - 01/27/2020) -  BUN 6 - 20 mg/dL - 13 -  Creatinine 532(D - 1.24 mg/dL - 9.24 2.68  Sodium 3.41 - 145 mmol/L - 138 -  Potassium 3.5 - 5.1 mmol/L - 4.2 -  Chloride 98 - 111 mmol/L - 109 -    CO2 22 - 32 mmol/L - 20(L) -  Calcium 8.9 - 10.3 mg/dL - 9.2 -  Total Protein 6.5 - 8.1 g/dL 6.0(L) - -  Total Bilirubin 0.3 - 1.2 mg/dL 0.6 - -  Alkaline Phos 38 - 126 U/L 62 - -  AST 15 - 41 U/L 41 - -  ALT 0 - 44 U/L 43 - -     Lipid Panel Lab Results  Component Value Date   CHOL 194 11/27/2019   HDL 53 11/27/2019   LDLCALC 129 (H) 11/27/2019  TRIG 58 11/27/2019   CHOLHDL 3.7 11/27/2019      Wt Readings from Last 3 Encounters:  04/04/20 188 lb (85.3 kg)  02/02/20 185 lb (83.9 kg)  12/11/19 183 lb (83 kg)     Other studies Reviewed: Additional studies/ records that were reviewed today include: Office notes, hospital records and testing.  ASSESSMENT AND PLAN:  1.  CAD:  Pt denie CP   Mod CAD  CP in summer may have been GI>  He is on protonix now   I asked him to taper to qod and see how he feels    If continues should prob see GI    Will continue to manage cardiac risk factors   2.  Hypertension:  BP is controlled  He can try tapering metorprolol and follow BP and how he feels   Keep on amlodipine   3.  Hyperlipidemia  On Crestor   Will check lipids today    4.  Bicuspid aortic valve   Mild AI on echo   Will repeat in the spring   5  ED   Rx for sildenifl given  Instructed not to use NTG anytime with it  IF does not work consider urol eval   5.  Tobacco use  Tob use is down but still a heavy smoker   Discussed quitting  And therapies to quit   Will continue to counsel   Plan for f/u in April 2022   Current medicines are reviewed at length with the patient today.  The patient does not have concerns regarding medicines.  The following changes have been made:  no change  Labs/ tests ordered today include:  No orders of the defined types were placed in this encounter.    Disposition:   FU with [name], MD 3 months  Signed, [name], MD  04/04/2020 9:55 AM    [facility] Phone: [phone]; Fax: [phone]

## 2020-04-04 NOTE — Patient Instructions (Addendum)
Medication Instructions:    START TAKING:    1. PROTONIX EVERY OTHER DAY ( SYMPTOMS WORSENING) TAKE EVERY DAY    2. 12.5 MG  METOPROLOL SUCCINATE (TOPROL XL)  ONCE A DAY    ( MONITOR BLOOD PRESSURE TO DETERMINE IF YOU NEED TO TAKE WHOLE TABLET IF TOP NUMBER CONSISTENTY 120'S OR HIGHER TO TAKE WHOLE TABLET)    3. SILDENAFIL (VIAGRA) 50 MG AS NEEDED ( FOR FUTURE REFILS CONTACT PRIMARY CARE PROVIDER)  *If you need a refill on your cardiac medications before your next appointment, please call your pharmacy*   Lab Work: LIVER AND LIPID TODAY   If you have labs (blood work) drawn today and your tests are completely normal, you will receive your results only by: Marland Kitchen MyChart Message (if you have MyChart) OR . A paper copy in the mail If you have any lab test that is abnormal or we need to change your treatment, we will call you to review the results.   Testing/Procedures: BEFORE FOLLOW UP IN April : Your physician has requested that you have an echocardiogram. Echocardiography is a painless test that uses sound waves to create images of your heart. It provides your doctor with information about the size and shape of your heart and how well your heart's chambers and valves are working. This procedure takes approximately one hour. There are no restrictions for this procedure.   Follow-Up: At [facility], you and your health needs are our priority.  As part of our continuing mission to provide you with exceptional heart care, we have created designated Provider Care Teams.  These Care Teams include your primary Cardiologist (physician) and Advanced Practice Providers (APPs -  Physician Assistants and Nurse Practitioners) who all work together to provide you with the care you need, when you need it.  We recommend signing up for the patient portal called "MyChart".  Sign up information is provided on this After Visit Summary.  MyChart is used to connect with patients for Virtual Visits  (Telemedicine).  Patients are able to view lab/test results, encounter notes, upcoming appointments, etc.  Non-urgent messages can be sent to your provider as well.   To learn more about what you can do with MyChart, go to [email or web address].    Your next appointment:    6 month(s)  Mid April Per Dr. Tenny Craw   The format for your next appointment:   In Person  Provider:   [name], MD   Other Instructions

## 2020-04-06 ENCOUNTER — Telehealth: Payer: Self-pay

## 2020-04-06 DIAGNOSIS — R7989 Other specified abnormal findings of blood chemistry: Secondary | ICD-10-CM

## 2020-04-06 NOTE — Telephone Encounter (Signed)
-----   Message from [name] V, MD sent at 04/05/2020  6:53 PM EDT ----- LDL is better than in June Currently 99  Still , goal is 70 or below His liver enzymes are mildy elevated    I would stop crestor an repealt liver panel in 3 wks   May or may not be related

## 2020-04-06 NOTE — Telephone Encounter (Signed)
I spoke with patient.He will hold crestor and repeat lft's in 3 weeks.I mailed lab slip to him

## 2020-04-27 ENCOUNTER — Other Ambulatory Visit: Payer: Self-pay

## 2020-04-27 ENCOUNTER — Other Ambulatory Visit (HOSPITAL_COMMUNITY)
Admission: RE | Admit: 2020-04-27 | Discharge: 2020-04-27 | Disposition: A | Discharge status: Home / Self Care | Payer: BC Managed Care – PPO | Source: Ambulatory Visit | Attending: Internal Medicine | Admitting: Internal Medicine

## 2020-04-27 DIAGNOSIS — R7989 Other specified abnormal findings of blood chemistry: Secondary | ICD-10-CM | POA: Insufficient documentation

## 2020-04-27 LAB — HEPATIC FUNCTION PANEL
ALT: 37 U/L (ref 0–44)
AST: 26 U/L (ref 15–41)
Albumin: 4.3 g/dL (ref 3.5–5.0)
Alkaline Phosphatase: 67 U/L (ref 38–126)
Bilirubin, Direct: 0.1 mg/dL (ref 0.0–0.2)
Indirect Bilirubin: 1.1 mg/dL — ABNORMAL HIGH (ref 0.3–0.9)
Total Bilirubin: 1.2 mg/dL (ref 0.3–1.2)
Total Protein: 7.3 g/dL (ref 6.5–8.1)

## 2020-04-29 ENCOUNTER — Telehealth: Payer: Self-pay | Admitting: *Deleted

## 2020-04-29 DIAGNOSIS — R7989 Other specified abnormal findings of blood chemistry: Secondary | ICD-10-CM

## 2020-04-29 DIAGNOSIS — I2510 Atherosclerotic heart disease of native coronary artery without angina pectoris: Secondary | ICD-10-CM

## 2020-04-29 MED ORDER — ROSUVASTATIN CALCIUM 10 MG PO TABS: 10.0000 mg | ORAL_TABLET | ORAL | 3 refills | Status: DC

## 2020-04-29 NOTE — Telephone Encounter (Signed)
-----   Message from Pricilla Riffle, MD sent at 04/28/2020 11:54 AM EDT ----- Liver numbers have gone back down to normal   They were very mildly elevated   May have been coincidental, my not associatd with Crestor    Given hx of CAD and the data that statins are very beneficial, would Luis Chang be willing to try a low dose every other day (10 mg) and follow lipid and liver in 2 months Luis Chang dont think Luis Chang will have any long term negative on liver by doing this  Luis Chang has tolerated before   Again, no proof that Crestor was the problem  May have been something viral,

## 2020-04-29 NOTE — Telephone Encounter (Signed)
Spoke with patient's wife (DPR) and reviewed results and recommendations. Pt will restart Crestor 10 mg every other day and return for lipids/liver lab work. Scheduled for Jan 18 as per her request.

## 2020-06-15 IMAGING — DX DG CHEST 2V
2 series · 2 of 2 positions shown · non-contrast
Comparison: 10/07/2016

CLINICAL DATA: Chest pain, diaphoresis, shoulder pain 30 minutes
ago

EXAM:
CHEST - 2 VIEW

[chest pa]
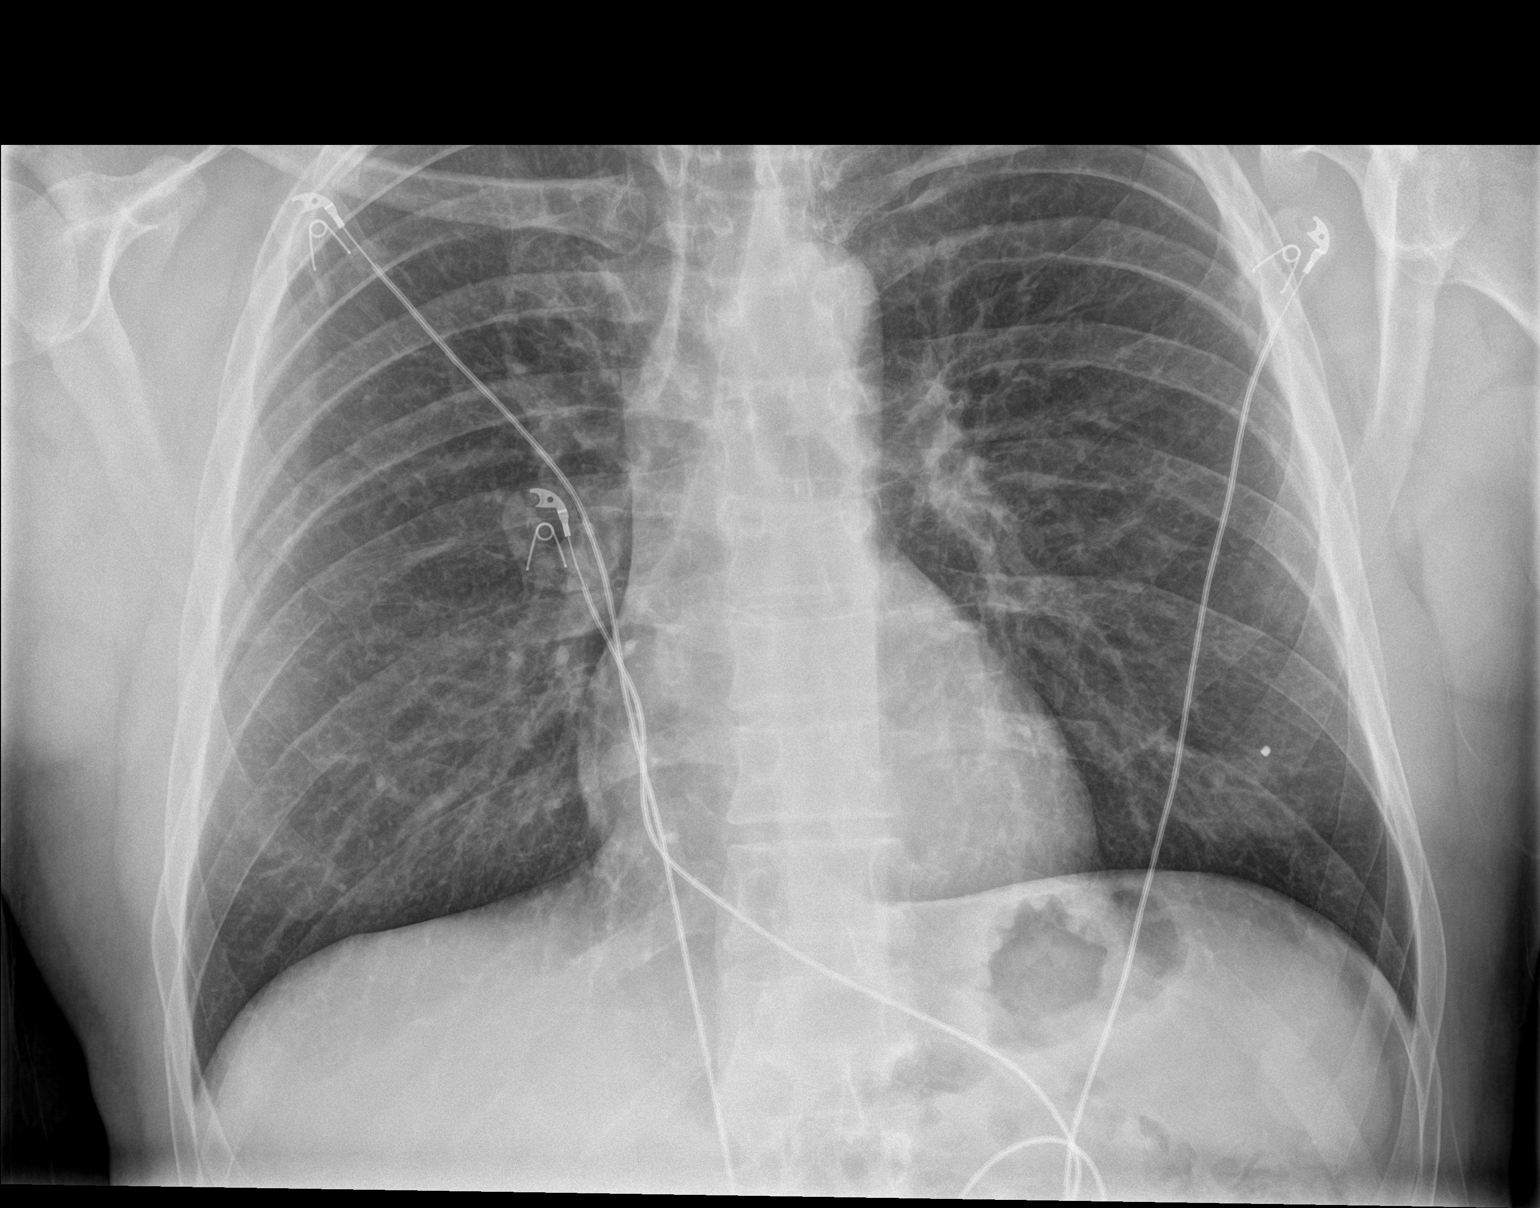

[chest lat]
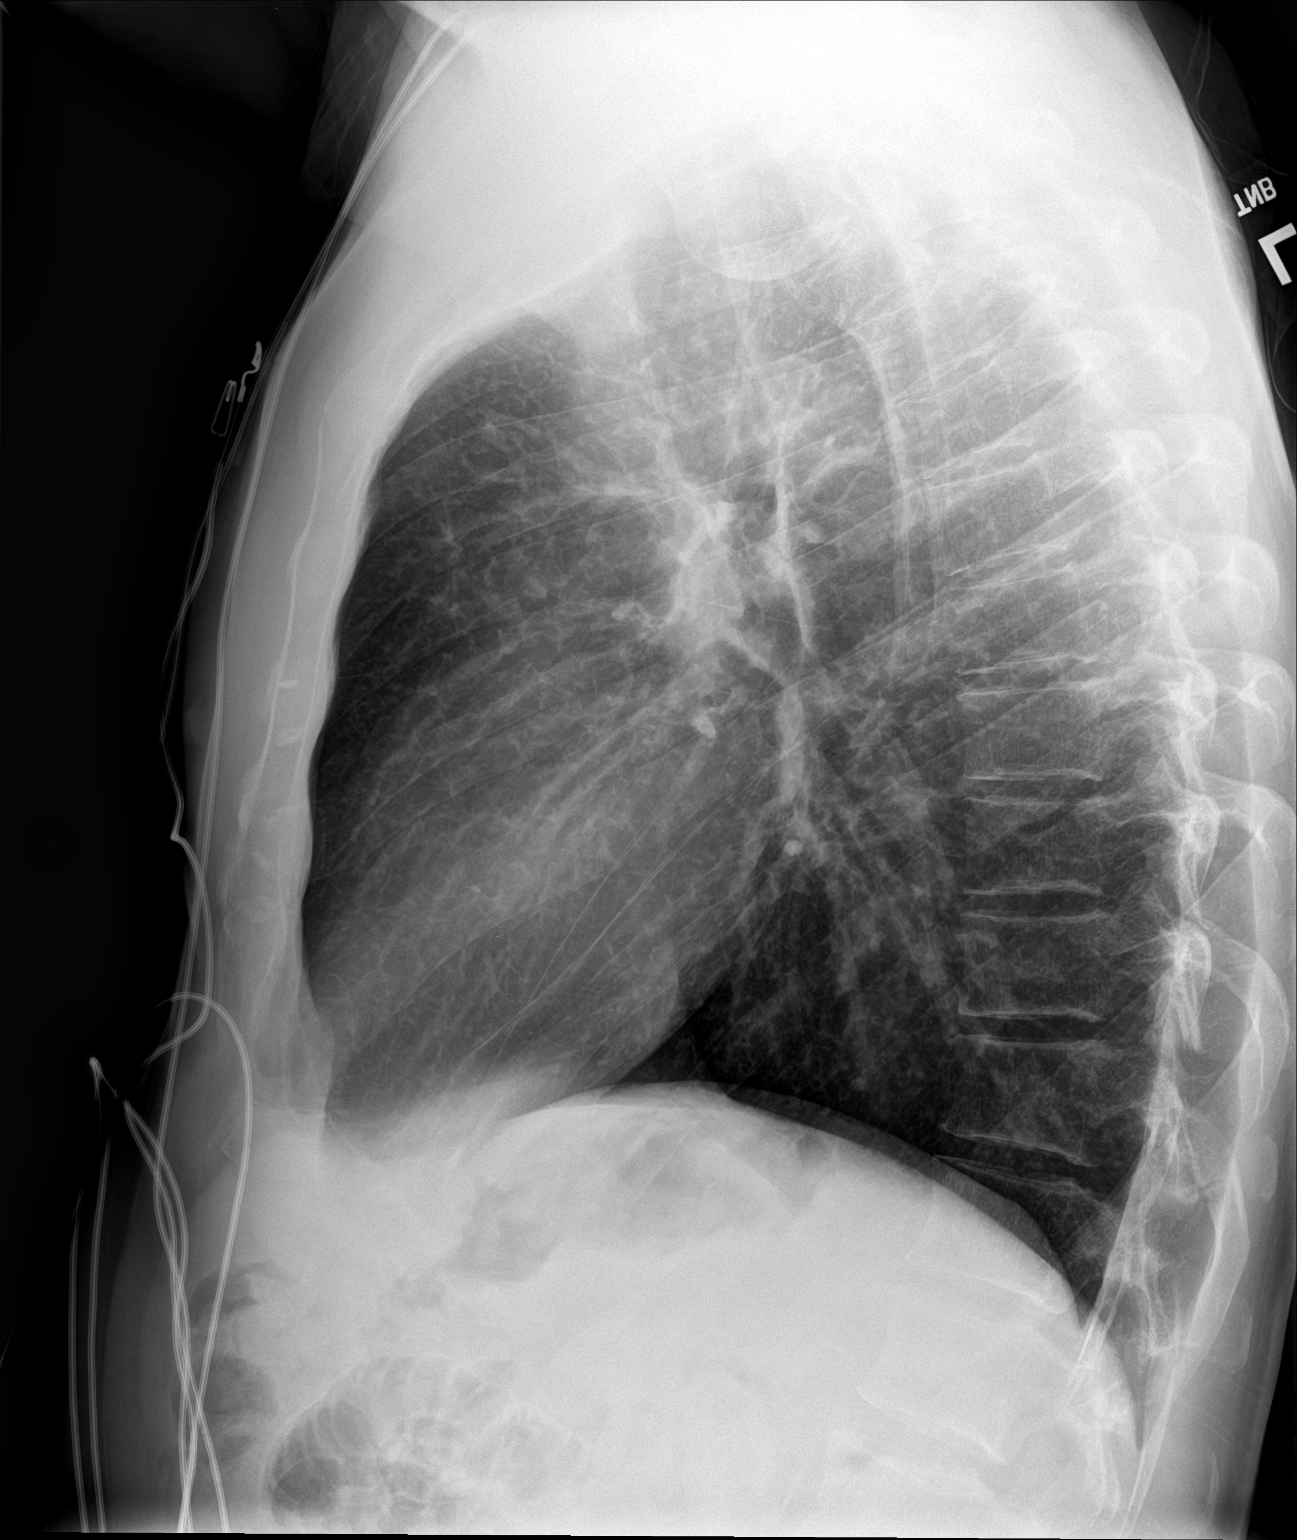

[2 of 2 positions shown; findings below may reference images not displayed]

FINDINGS: The heart size and mediastinal contours are within normal limits.
Both lungs are clear. The visualized skeletal structures are
unremarkable.
IMPRESSION: No active cardiopulmonary disease.

## 2020-07-13 ENCOUNTER — Other Ambulatory Visit (HOSPITAL_COMMUNITY)
Admission: RE | Admit: 2020-07-13 | Discharge: 2020-07-13 | Disposition: A | Discharge status: Home / Self Care | Payer: BC Managed Care – PPO | Source: Ambulatory Visit | Attending: Internal Medicine | Admitting: Internal Medicine

## 2020-07-13 DIAGNOSIS — R7989 Other specified abnormal findings of blood chemistry: Secondary | ICD-10-CM | POA: Insufficient documentation

## 2020-07-13 DIAGNOSIS — I2510 Atherosclerotic heart disease of native coronary artery without angina pectoris: Secondary | ICD-10-CM | POA: Insufficient documentation

## 2020-07-13 LAB — HEPATIC FUNCTION PANEL
ALT: 49 U/L — ABNORMAL HIGH (ref 0–44)
AST: 32 U/L (ref 15–41)
Albumin: 4.4 g/dL (ref 3.5–5.0)
Alkaline Phosphatase: 66 U/L (ref 38–126)
Bilirubin, Direct: 0.1 mg/dL (ref 0.0–0.2)
Indirect Bilirubin: 0.7 mg/dL (ref 0.3–0.9)
Total Bilirubin: 0.8 mg/dL (ref 0.3–1.2)
Total Protein: 7.4 g/dL (ref 6.5–8.1)

## 2020-07-13 LAB — LIPID PANEL
Cholesterol: 182 mg/dL (ref 0–200)
HDL: 48 mg/dL (ref >40)
LDL Cholesterol: 111 mg/dL — ABNORMAL HIGH (ref 0–99)
Total CHOL/HDL Ratio: 3.8 RATIO
Triglycerides: 116 mg/dL (ref <150)
VLDL: 23 mg/dL (ref 0–40)

## 2020-07-15 ENCOUNTER — Other Ambulatory Visit: Payer: Self-pay

## 2020-07-15 ENCOUNTER — Telehealth: Payer: Self-pay

## 2020-07-15 DIAGNOSIS — Z79899 Other long term (current) drug therapy: Secondary | ICD-10-CM

## 2020-07-15 NOTE — Telephone Encounter (Signed)
-----   Message from Lendon Ka, RN sent at 07/14/2020  3:22 PM EST ----- This is a [place] patient.

## 2020-07-15 NOTE — Telephone Encounter (Signed)
Patient called and notified of result. Patient states he is already on Crestor 10 mg every other day and would like to know if the 20 mg would be every other day as well.

## 2020-07-20 NOTE — Telephone Encounter (Signed)
Would go up to 20 mg every other day

## 2020-07-21 MED ORDER — ROSUVASTATIN CALCIUM 20 MG PO TABS: ORAL_TABLET | ORAL | 3 refills | Status: DC

## 2020-07-21 NOTE — Telephone Encounter (Signed)
I spoke with wife who does patients meds and she will change crestor to 20 mg every other day

## 2020-07-21 NOTE — Addendum Note (Signed)
Addended by: Marlyn Corporal A on: 07/21/2020 09:11 AM   Modules accepted: Orders

## 2020-08-10 ENCOUNTER — Other Ambulatory Visit: Payer: Self-pay | Admitting: Family Medicine

## 2020-09-12 ENCOUNTER — Other Ambulatory Visit: Payer: Self-pay

## 2020-09-12 ENCOUNTER — Ambulatory Visit (HOSPITAL_COMMUNITY)
Admission: RE | Admit: 2020-09-12 | Discharge: 2020-09-12 | Disposition: A | Discharge status: Home / Self Care | Payer: BC Managed Care – PPO | Source: Ambulatory Visit | Attending: Internal Medicine | Admitting: Internal Medicine

## 2020-09-12 DIAGNOSIS — I350 Nonrheumatic aortic (valve) stenosis: Secondary | ICD-10-CM | POA: Diagnosis not present

## 2020-09-12 LAB — ECHOCARDIOGRAM COMPLETE
AR max vel: 1.59 cm2
AV Area VTI: 1.77 cm2
AV Area mean vel: 1.57 cm2
AV Mean grad: 6.6 mmHg
AV Peak grad: 13.4 mmHg
Ao pk vel: 1.83 m/s
Area-P 1/2: 3.28 cm2
S' Lateral: 2.9 cm

## 2020-09-12 NOTE — Progress Notes (Signed)
*  PRELIMINARY RESULTS* Echocardiogram 2D Echocardiogram has been performed.  Stacey Drain 09/12/2020, 11:22 AM

## 2020-10-07 ENCOUNTER — Other Ambulatory Visit: Payer: Self-pay | Admitting: Internal Medicine

## 2020-12-15 ENCOUNTER — Ambulatory Visit: Payer: BC Managed Care – PPO | Admitting: Internal Medicine

## 2020-12-15 ENCOUNTER — Other Ambulatory Visit: Payer: Self-pay

## 2020-12-15 ENCOUNTER — Encounter: Payer: Self-pay | Admitting: Internal Medicine

## 2020-12-15 VITALS — BP 120/80 | HR 85 | Ht 71.0 in | Wt 195.0 lb

## 2020-12-15 DIAGNOSIS — E782 Mixed hyperlipidemia: Secondary | ICD-10-CM

## 2020-12-15 DIAGNOSIS — I10 Essential (primary) hypertension: Secondary | ICD-10-CM

## 2020-12-15 MED ORDER — SILDENAFIL CITRATE 50 MG PO TABS: 50.0000 mg | ORAL_TABLET | Freq: Every day | ORAL | 2 refills | Status: DC | PRN

## 2020-12-15 NOTE — Progress Notes (Signed)
Cardiology Office Note   Date:  12/16/2020   ID:  Nathanael, Krist 27-May-1976, MRN [id number]  PCP:  Derinda Late, MD Cardiologist:  Dorris Carnes, MD 11/30/2019 in-hospital Electrphysiologist: None Dorris Carnes, MD    History of Present Illness: Luis Chang is a 45 y.o. male with a history of HTN, HL, tobacco use and CAD  In June 2021 he was admitted ith CP   Triv elevation of tropoinin  (38)   Underwent cardiac cath which showed 50% D2; 50% mLCx.  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, veggies Diet  2 t o3 sodas per day   Past Medical History:  Diagnosis Date   Bicuspid aortic valve 11/28/2019   R/L cusp fusion   CAD (coronary artery disease) 11/30/2019   50% Diag2 & CFX, med rx   Dyslipidemia (high LDL; low HDL) 11/30/2019   HTN (hypertension)    Sleep walking     Past Surgical History:  Procedure Laterality Date   FOREIGN BODY REMOVAL  2007   RIGHT HAND   LEFT HEART CATH AND CORONARY ANGIOGRAPHY N/A 11/30/2019   Procedure: LEFT HEART CATH AND CORONARY ANGIOGRAPHY;  Surgeon: Nelva Bush, MD;  Location: [facility];  Service: Cardiovascular;  Laterality: N/A;   WISDOM TOOTH EXTRACTION      Current Outpatient Medications  Medication Sig Dispense Refill   acetaminophen (TYLENOL) 500 MG tablet Take 500 mg by mouth daily as needed for headache.     amLODipine (NORVASC) 5 MG tablet TAKE 1 TABLET BY MOUTH EVERY DAY 90 tablet 1   aspirin EC 81 MG EC tablet Take 1 tablet (81 mg total) by mouth daily.     Ibuprofen 200 MG CAPS Take 1 capsule by mouth daily as needed (headache).     metoprolol succinate (TOPROL XL) 25 MG 24 hr tablet Take 0.5 tablets (12.5 mg total) by mouth daily. 90 tablet 3   nitroGLYCERIN (NITROSTAT) 0.4 MG SL tablet Place 1  tablet (0.4 mg total) under the tongue every 5 (five) minutes x 3 doses as needed for chest pain. 25 tablet 12   pantoprazole (PROTONIX) 20 MG tablet Take 1 tablet (20 mg total) by mouth every other day. 90 tablet 3   rosuvastatin (CRESTOR) 20 MG tablet Take 20 mg every other day 90 tablet 3   sildenafil (VIAGRA) 50 MG tablet Take 1 tablet (50 mg total) by mouth daily as needed for erectile dysfunction. 30 tablet 2   No current facility-administered medications for this visit.    Allergies:   Patient has no known allergies.    Social History:  The patient  reports that he has been smoking cigarettes. He has been smoking an average of 2.00 packs per day. He has never used smokeless tobacco. He reports current alcohol use. He reports that he does not use drugs.   Family History:  The patient's family history includes Arthritis in an other family member; High blood pressure in his father and sister.  He indicated that his mother is alive. He indicated that his father is alive. He indicated that the status of his sister is unknown. He indicated that his brother is alive. He  indicated that the status of his other is unknown.   ROS:  Please see the history of present illness. All other systems are reviewed and negative.    PHYSICAL EXAM: VS:  BP 120/80   Pulse 85   Ht _0  (1.803 m)   Wt 195 lb (88.5 kg)   SpO2 98%   BMI 27.20 kg/m  , BMI Body mass index is 27.2 kg/m. GEN: Well nourished, well developed, male in no acute distress HEENT: normal   Neck: no JVD, no carotid buit Cardiac: RRR; Gr I/VI diastolic murmur LSB  2 + pulses distally  Respiratory:  clear to auscultation   GI: soft, nontender, nondistended, + BS MS: no deformity or atrophy; no edema;  Skin: warm and dry, no rash Neuro:  Strength and sensation are intact Psych: euthymic mood, full affect   EKG:  EKG ordered today.  SR 85 bpm     CARDIAC CATH: 11/30/2019 Conclusion: Moderate two-vessel coronary artery  disease with 50% lesions involving D2 and mid LCx.  No significant stenosis noted in the LAD or RCA. Normal left ventricular filling pressure.   Recommendations: Aggressive medical therapy and risk factor modifications to prevent progression of disease. Full results below   ECHO: 11/28/2019  1. Left ventricular ejection fraction, by estimation, is 50 to 55%. The left ventricle has low normal function. The left ventricle has no regional wall motion abnormalities. Left ventricular diastolic parameters were normal.   2. Right ventricular systolic function is normal. The right ventricular size is normal.   3. The mitral valve is myxomatous. No evidence of mitral valve regurgitation. No evidence of mitral stenosis.   4. Bicuspid valve - right and left cusp fusion. . The aortic valve is bicuspid. Aortic valve regurgitation is mild. Mild to moderate aortic valve sclerosis/calcification is present, without any evidence of aortic stenosis.   5. The inferior vena cava is normal in size with greater than 50% respiratory variability, suggesting right atrial pressure of 3 mmHg.    Recent Labs: 07/13/2020: ALT 49  CBC    Component Value Date/Time   WBC 8.4 11/30/2019 0607   RBC 5.06 11/30/2019 0607   HGB 16.5 11/30/2019 0607   HCT 47.3 11/30/2019 0607   PLT 182 11/30/2019 0607   MCV 93.5 11/30/2019 0607   MCH 32.6 11/30/2019 0607   MCHC 34.9 11/30/2019 0607   RDW 12.0 11/30/2019 0607   LYMPHSABS 3.0 10/07/2016 0127   MONOABS 0.8 10/07/2016 0127   EOSABS 0.3 10/07/2016 0127   BASOSABS 0.1 10/07/2016 0127   CMP Latest Ref Rng & Units 07/13/2020 04/27/2020 04/04/2020  Glucose 70 - 99 mg/dL - - -  BUN 6 - 20 mg/dL - - -  Creatinine 0.61 - 1.24 mg/dL - - -  Sodium 135 - 145 mmol/L - - -  Potassium 3.5 - 5.1 mmol/L - - -  Chloride 98 - 111 mmol/L - - -  CO2 22 - 32 mmol/L - - -  Calcium 8.9 - 10.3 mg/dL - - -  Total Protein 6.5 - 8.1 g/dL 7.4 7.3 7.8  Total Bilirubin 0.3 - 1.2 mg/dL 0.8 1.2 1.0   Alkaline Phos 38 - 126 U/L 66 67 72  AST 15 - 41 U/L 32 26 54(H)  ALT 0 - 44 U/L 49(H) 37 91(H)     Lipid Panel Lab Results  Component Value Date   CHOL 182 07/13/2020   HDL 48 07/13/2020   LDLCALC 111 (H) 07/13/2020   TRIG 116  07/13/2020   CHOLHDL 3.8 07/13/2020      Wt Readings from Last 3 Encounters:  12/15/20 195 lb (88.5 kg)  04/04/20 188 lb (85.3 kg)  02/02/20 185 lb (83.9 kg)     Other studies Reviewed: Additional studies/ records that were reviewed today include: Office notes, hospital records and testing.  ASSESSMENT AND PLAN:  1.  CAD:  Mod CAD at cath 1 year ago  No symptomsi of angina   Folllow   2.  Hypertension:  BP is controlled   Will follow   3.  Hyperlipidemia  Taking Crstor a few days per wk   Check lipomed with Lp(a) and ApoB      Discussed diet   Mediterranean diet   Cut sugar out     4.  Bicuspid aortic valve   Mild AI on echo  F/U with echo in futre    5  ED   Sildenifl as needed  5.  Tobacco use  Need to review  Plan for f/u in the winter    Current medicines are reviewed at length with the patient today.  The patient does not have concerns regarding medicines.  The following changes have been made:  no change  Labs/ tests ordered today include:   Orders Placed This Encounter  Procedures   NMR, lipoprofile   Apolipoprotein B   Lipoprotein A (LPA)      Disposition:   FU with Dorris Carnes, MD 3 months  Signed, Dorris Carnes, MD  12/16/2020 12:06 AM    [place] Phone: [phone]; Fax: [phone]

## 2020-12-15 NOTE — Patient Instructions (Signed)
Medication Instructions:  Your physician recommends that you continue on your current medications as directed. Please refer to the Current Medication list given to you today.  *If you need a refill on your cardiac medications before your next appointment, please call your pharmacy*   Lab Work: NMR Apo-B LP(a) If you have labs (blood work) drawn today and your tests are completely normal, you will receive your results only by: MyChart Message (if you have MyChart) OR A paper copy in the mail If you have any lab test that is abnormal or we need to change your treatment, we will call you to review the results.   Testing/Procedures: None   Follow-Up: At [facility], you and your health needs are our priority.  As part of our continuing mission to provide you with exceptional heart care, we have created designated Provider Care Teams.  These Care Teams include your primary Cardiologist (physician) and Advanced Practice Providers (APPs -  Physician Assistants and Nurse Practitioners) who all work together to provide you with the care you need, when you need it.  We recommend signing up for the patient portal called "MyChart".  Sign up information is provided on this After Visit Summary.  MyChart is used to connect with patients for Virtual Visits (Telemedicine).  Patients are able to view lab/test results, encounter notes, upcoming appointments, etc.  Non-urgent messages can be sent to your provider as well.   To learn more about what you can do with MyChart, go to [email or web address].    Your next appointment:   Spring 2023  The format for your next appointment:   In Person  Provider:   [name], MD   Other Instructions

## 2020-12-28 DIAGNOSIS — E782 Mixed hyperlipidemia: Secondary | ICD-10-CM | POA: Diagnosis not present

## 2020-12-29 LAB — APOLIPOPROTEIN B: Apolipoprotein B: 86 mg/dL (ref <90)

## 2020-12-29 LAB — NMR, LIPOPROFILE
Cholesterol, Total: 176 mg/dL (ref 100–199)
HDL Particle Number: 43.9 umol/L (ref >=30.5)
HDL-C: 52 mg/dL (ref >39)
LDL Particle Number: 936 nmol/L (ref <1000)
LDL Size: 20.5 nm — ABNORMAL LOW (ref >20.5)
LDL-C (NIH Calc): 87 mg/dL (ref 0–99)
LP-IR Score: 64 — ABNORMAL HIGH (ref <=45)
Small LDL Particle Number: 577 nmol/L — ABNORMAL HIGH (ref <=527)
Triglycerides: 220 mg/dL — ABNORMAL HIGH (ref 0–149)

## 2020-12-29 LAB — LIPOPROTEIN A (LPA): Lipoprotein (a): 100.7 nmol/L — ABNORMAL HIGH (ref <75.0)

## 2021-01-02 ENCOUNTER — Other Ambulatory Visit: Payer: Self-pay | Admitting: Family Medicine

## 2021-01-04 ENCOUNTER — Telehealth: Payer: Self-pay

## 2021-01-04 MED ORDER — ROSUVASTATIN CALCIUM 20 MG PO TABS: 20.0000 mg | ORAL_TABLET | Freq: Every day | ORAL | 3 refills | Status: DC

## 2021-01-04 NOTE — Telephone Encounter (Signed)
Left message for patient to give our office a call for results and medication management. Will mail letter.   Medication list updated to reflect changes made.

## 2021-01-04 NOTE — Telephone Encounter (Signed)
-----   Message from Pricilla Riffle, MD sent at 01/01/2021 10:17 PM EDT ----- See accompanying note

## 2021-01-09 NOTE — Addendum Note (Signed)
Addended by: Brookelyn Gaynor C on: 01/09/2021 01:36 PM   Modules accepted: Orders  

## 2021-03-13 ENCOUNTER — Other Ambulatory Visit: Payer: Self-pay | Admitting: Physician Assistant

## 2021-04-03 ENCOUNTER — Other Ambulatory Visit: Payer: Self-pay | Admitting: Family Medicine

## 2021-11-22 ENCOUNTER — Other Ambulatory Visit: Payer: Self-pay | Admitting: Internal Medicine

## 2021-12-16 ENCOUNTER — Other Ambulatory Visit: Payer: Self-pay | Admitting: Internal Medicine

## 2021-12-18 ENCOUNTER — Telehealth: Payer: Self-pay | Admitting: Cardiology

## 2021-12-18 MED ORDER — AMLODIPINE BESYLATE 5 MG PO TABS: 5.0000 mg | ORAL_TABLET | Freq: Every day | ORAL | 0 refills | Status: DC

## 2021-12-18 NOTE — Telephone Encounter (Signed)
Completed.

## 2022-01-13 ENCOUNTER — Other Ambulatory Visit: Payer: Self-pay | Admitting: Internal Medicine

## 2022-02-12 ENCOUNTER — Emergency Department (HOSPITAL_BASED_OUTPATIENT_CLINIC_OR_DEPARTMENT_OTHER): Payer: BC Managed Care – PPO

## 2022-02-12 ENCOUNTER — Emergency Department (HOSPITAL_BASED_OUTPATIENT_CLINIC_OR_DEPARTMENT_OTHER)
Admission: EM | Admit: 2022-02-12 | Discharge: 2022-02-12 | Disposition: A | Discharge status: Home / Self Care | Payer: BC Managed Care – PPO | Attending: Emergency Medicine | Admitting: Emergency Medicine

## 2022-02-12 ENCOUNTER — Other Ambulatory Visit: Payer: Self-pay

## 2022-02-12 ENCOUNTER — Emergency Department (HOSPITAL_BASED_OUTPATIENT_CLINIC_OR_DEPARTMENT_OTHER): Payer: BC Managed Care – PPO | Admitting: Radiology

## 2022-02-12 ENCOUNTER — Telehealth: Payer: Self-pay | Admitting: Internal Medicine

## 2022-02-12 ENCOUNTER — Encounter (HOSPITAL_BASED_OUTPATIENT_CLINIC_OR_DEPARTMENT_OTHER): Payer: Self-pay | Admitting: Obstetrics and Gynecology

## 2022-02-12 DIAGNOSIS — R42 Dizziness and giddiness: Secondary | ICD-10-CM | POA: Insufficient documentation

## 2022-02-12 DIAGNOSIS — I10 Essential (primary) hypertension: Secondary | ICD-10-CM | POA: Diagnosis not present

## 2022-02-12 DIAGNOSIS — Z87891 Personal history of nicotine dependence: Secondary | ICD-10-CM | POA: Diagnosis not present

## 2022-02-12 DIAGNOSIS — R791 Abnormal coagulation profile: Secondary | ICD-10-CM | POA: Insufficient documentation

## 2022-02-12 DIAGNOSIS — Z79899 Other long term (current) drug therapy: Secondary | ICD-10-CM | POA: Insufficient documentation

## 2022-02-12 DIAGNOSIS — Z7982 Long term (current) use of aspirin: Secondary | ICD-10-CM | POA: Insufficient documentation

## 2022-02-12 DIAGNOSIS — R200 Anesthesia of skin: Secondary | ICD-10-CM | POA: Insufficient documentation

## 2022-02-12 DIAGNOSIS — I2510 Atherosclerotic heart disease of native coronary artery without angina pectoris: Secondary | ICD-10-CM | POA: Insufficient documentation

## 2022-02-12 LAB — CBC
HCT: 47.3 % (ref 39.0–52.0)
Hemoglobin: 16.7 g/dL (ref 13.0–17.0)
MCH: 31.5 pg (ref 26.0–34.0)
MCHC: 35.3 g/dL (ref 30.0–36.0)
MCV: 89.1 fL (ref 80.0–100.0)
Platelets: 291 10*3/uL (ref 150–400)
RBC: 5.31 MIL/uL (ref 4.22–5.81)
RDW: 12.2 % (ref 11.5–15.5)
WBC: 11.4 10*3/uL — ABNORMAL HIGH (ref 4.0–10.5)
nRBC: 0 % (ref 0.0–0.2)

## 2022-02-12 LAB — COMPREHENSIVE METABOLIC PANEL
ALT: 52 U/L — ABNORMAL HIGH (ref 0–44)
AST: 33 U/L (ref 15–41)
Albumin: 4.8 g/dL (ref 3.5–5.0)
Alkaline Phosphatase: 86 U/L (ref 38–126)
Anion gap: 13 (ref 5–15)
BUN: 14 mg/dL (ref 6–20)
CO2: 22 mmol/L (ref 22–32)
Calcium: 9.8 mg/dL (ref 8.9–10.3)
Chloride: 102 mmol/L (ref 98–111)
Creatinine, Ser: 1.13 mg/dL (ref 0.61–1.24)
GFR, Estimated: >60 mL/min (ref >60)
Glucose, Bld: 93 mg/dL (ref 70–99)
Potassium: 4 mmol/L (ref 3.5–5.1)
Sodium: 137 mmol/L (ref 135–145)
Total Bilirubin: 0.9 mg/dL (ref 0.3–1.2)
Total Protein: 8 g/dL (ref 6.5–8.1)

## 2022-02-12 LAB — TROPONIN I (HIGH SENSITIVITY)
Troponin I (High Sensitivity): 3 ng/L (ref <18)
Troponin I (High Sensitivity): 3 ng/L (ref <18)

## 2022-02-12 LAB — APTT: aPTT: 32 seconds (ref 24–36)

## 2022-02-12 LAB — DIFFERENTIAL
Abs Immature Granulocytes: 0.04 10*3/uL (ref 0.00–0.07)
Basophils Absolute: 0.1 10*3/uL (ref 0.0–0.1)
Basophils Relative: 1 %
Eosinophils Absolute: 0.2 10*3/uL (ref 0.0–0.5)
Eosinophils Relative: 1 %
Immature Granulocytes: 0 %
Lymphocytes Relative: 25 %
Lymphs Abs: 2.8 10*3/uL (ref 0.7–4.0)
Monocytes Absolute: 0.7 10*3/uL (ref 0.1–1.0)
Monocytes Relative: 6 %
Neutro Abs: 7.6 10*3/uL (ref 1.7–7.7)
Neutrophils Relative %: 67 %

## 2022-02-12 LAB — CBG MONITORING, ED: Glucose-Capillary: 105 mg/dL — ABNORMAL HIGH (ref 70–99)

## 2022-02-12 LAB — ETHANOL: Alcohol, Ethyl (B): <10 mg/dL (ref <10)

## 2022-02-12 LAB — PROTIME-INR
INR: 1 (ref 0.8–1.2)
Prothrombin Time: 13 seconds (ref 11.4–15.2)

## 2022-02-12 MED ORDER — ONDANSETRON 4 MG PO TBDP
4.0000 mg | ORAL_TABLET | Freq: Once | ORAL | Status: AC
  Administered 2022-02-12: 4 mg via ORAL
  Filled 2022-02-12: qty 1

## 2022-02-12 NOTE — ED Triage Notes (Signed)
Patient reports to the ER for numbness in his left arm and tingling with high blood pressure and dizziness since 0300

## 2022-02-12 NOTE — ED Provider Notes (Signed)
[facility] EMERGENCY DEPT Provider Note  CSN: [id number] Arrival date & time: 02/12/22 1452  Chief Complaint(s) Dizziness and Numbness  HPI Luis Chang is a 46 y.o. male with history of bicuspid aortic valve, coronary artery disease, hypertension, hyperlipidemia presenting to the emergency department with lightheadedness.  Patient reports lightheadedness since yesterday, reports feeling like he is generalized weakness.  He reports associated tingling in his left hand.  He reports diaphoresis, nausea.  No vomiting or syncope.  No chest pain.  Reports feels similar to when he was admitted to the hospital for heart problem, although at that time he did have chest pain and he currently has no chest pain or has he during this episode.  He reports his symptoms are constant since 3 AM.  Denies any weakness, facial droop, speech difficulties, vision changes, difficulty walking.  No shortness of breath.  No cough.  No runny nose, sore throat, fevers, chills.   Past Medical History Past Medical History:  Diagnosis Date  . Bicuspid aortic valve 11/28/2019   R/L cusp fusion  . CAD (coronary artery disease) 11/30/2019   50% Diag2 & CFX, med rx  . Dyslipidemia (high LDL; low HDL) 11/30/2019  . HTN (hypertension)   . Sleep walking    Patient Active Problem List   Diagnosis Date Noted  . Dyslipidemia (high LDL; low HDL) 11/30/2019  . Unstable angina ([place]) 11/27/2019  . Distal radial fracture 02/13/2012  . FRACTURE, HAND 05/08/2010  . CLOSED FRACTURE OF SHAFT OF METACARPAL BONE 05/08/2010  . CLOS FRACTURE MID/PROXIMAL PHALANX/PHALANG HAND 05/08/2010  . FRACTURE, TIBIAL PLATEAU 05/08/2010   Home Medication(s) Prior to Admission medications   Medication Sig Start Date End Date Taking? Authorizing Provider  acetaminophen (TYLENOL) 500 MG tablet Take 500 mg by mouth daily as needed for headache.    [provider]  amLODipine (NORVASC) 5 MG tablet Take 1 tablet (5 mg total) by  mouth daily. 12/18/21   Fay Records, MD  aspirin EC 81 MG EC tablet Take 1 tablet (81 mg total) by mouth daily. 12/01/19   Barrett, Evelene Croon, PA-C  Ibuprofen 200 MG CAPS Take 1 capsule by mouth daily as needed (headache).    [provider]  metoprolol succinate (TOPROL-XL) 25 MG 24 hr tablet TAKE 1/2 TABLET BY MOUTH EVERY DAY 01/15/22   Fay Records, MD  nitroGLYCERIN (NITROSTAT) 0.4 MG SL tablet PLACE 1 TABLET UNDER THE TONGUE EVERY 5 (FIVE) MINUTES X 3 DOSES AS NEEDED FOR CHEST PAIN. 03/13/21   Fay Records, MD  pantoprazole (PROTONIX) 20 MG tablet Take 1 tablet (20 mg total) by mouth every other day. 04/04/20   Fay Records, MD  rosuvastatin (CRESTOR) 20 MG tablet TAKE 1 TABLET BY MOUTH EVERY DAY 01/15/22   Fay Records, MD  sildenafil (VIAGRA) 50 MG tablet Take 1 tablet (50 mg total) by mouth daily as needed for erectile dysfunction. 12/15/20   Fay Records, MD  Past Surgical History Past Surgical History:  Procedure Laterality Date  . FOREIGN BODY REMOVAL  2007   RIGHT HAND  . LEFT HEART CATH AND CORONARY ANGIOGRAPHY N/A 11/30/2019   Procedure: LEFT HEART CATH AND CORONARY ANGIOGRAPHY;  Surgeon: Nelva Bush, MD;  Location: [facility];  Service: Cardiovascular;  Laterality: N/A;  . WISDOM TOOTH EXTRACTION     Family History Family History  Problem Relation Age of Onset  . Arthritis Other   . High blood pressure Father   . High blood pressure Sister     Social History Social History   Tobacco Use  . Smoking status: Former    Packs/day: 2.00    Types: Cigarettes  . Smokeless tobacco: Never  Vaping Use  . Vaping Use: Every day  . Substances: Nicotine, Flavoring  Substance Use Topics  . Alcohol use: Yes    Comment: OCCASIONALLY  . Drug use: No   Allergies Patient has no known allergies.  Review of Systems Review of Systems   Constitutional:  Negative for chills and fever.  HENT:  Negative for ear pain and sore throat.   Eyes:  Negative for pain and visual disturbance.  Respiratory:  Negative for cough and shortness of breath.   Cardiovascular:  Negative for chest pain and palpitations.  Gastrointestinal:  Negative for abdominal pain and vomiting.  Genitourinary:  Negative for dysuria and hematuria.  Musculoskeletal:  Negative for arthralgias and back pain.  Skin:  Negative for color change and rash.  Neurological:  Negative for seizures and syncope.  All other systems reviewed and are negative.   Physical Exam Vital Signs  I have reviewed the triage vital signs BP (!) 150/91   Pulse 67   Temp 97.9 F (36.6 C)   Resp 12   Ht 5\' 11"  (1.803 m)   Wt 90.7 kg   SpO2 100%   BMI 27.89 kg/m  Physical Exam Vitals and nursing note reviewed.  Constitutional:      General: He is not in acute distress.    Appearance: Normal appearance.  HENT:     Mouth/Throat:     Mouth: Mucous membranes are moist.  Cardiovascular:     Rate and Rhythm: Normal rate and regular rhythm.  Pulmonary:     Effort: Pulmonary effort is normal. No respiratory distress.     Breath sounds: Normal breath sounds.  Abdominal:     General: Abdomen is flat.     Palpations: Abdomen is soft.     Tenderness: There is no abdominal tenderness.  Skin:    General: Skin is warm and dry.     Capillary Refill: Capillary refill takes less than 2 seconds.  Neurological:     Mental Status: He is alert and oriented to person, place, and time. Mental status is at baseline.     Comments: Cranial nerves II through XII intact, strength 5 out of 5 in the bilateral upper and lower extremities, no sensory deficit to light touch, no dysmetria on finger-nose-finger testing, ambulatory with steady gait.   Psychiatric:        Mood and Affect: Mood normal.        Behavior: Behavior normal.     ED Results and Treatments Labs (all labs ordered are  listed, but only abnormal results are displayed) Labs Reviewed  CBC - Abnormal; Notable for the following components:      Result Value   WBC 11.4 (*)    All other components within normal limits  COMPREHENSIVE METABOLIC PANEL - Abnormal; Notable for the following components:   ALT 52 (*)    All other components within normal limits  CBG MONITORING, ED - Abnormal; Notable for the following components:   Glucose-Capillary 105 (*)    All other components within normal limits  PROTIME-INR  APTT  DIFFERENTIAL  ETHANOL  TROPONIN I (HIGH SENSITIVITY)  TROPONIN I (HIGH SENSITIVITY)                                                                                                                          Radiology DG Chest 2 View  Result Date: 02/12/2022 CLINICAL DATA:  Lightheadedness EXAM: CHEST - 2 VIEW COMPARISON:  11/27/2019, 08/22/2013 FINDINGS: The heart size and mediastinal contours are within normal limits. Both lungs are clear. The visualized skeletal structures are unremarkable. Punctate radiopaque density projecting within the left chest wall is unchanged since 2015. IMPRESSION: No active cardiopulmonary disease. Electronically Signed   By: Duanne Guess D.O.   On: 02/12/2022 18:51   CT HEAD WO CONTRAST  Result Date: 02/12/2022 CLINICAL DATA:  Neuro deficit, acute, stroke suspected Numbness and tingling in left arm. Dizziness. Elevated blood pressure. EXAM: CT HEAD WITHOUT CONTRAST TECHNIQUE: Contiguous axial images were obtained from the base of the skull through the vertex without intravenous contrast. RADIATION DOSE REDUCTION: This exam was performed according to the departmental dose-optimization program which includes automated exposure control, adjustment of the mA and/or kV according to patient size and/or use of iterative reconstruction technique. COMPARISON:  10/07/2016 FINDINGS: Brain: No intracranial hemorrhage, mass effect, or midline shift. No hydrocephalus. The basilar  cisterns are patent. No evidence of territorial infarct or acute ischemia. No extra-axial or intracranial fluid collection. Vascular: No hyperdense vessel or unexpected calcification. Skull: Normal. Negative for fracture or focal lesion. Sinuses/Orbits: Minor mucosal thickening of ethmoid air cells. Mucosal thickening and minimal fluid within both frontal sinuses. No mastoid effusion. Unremarkable appearance of the orbits. Other: None. IMPRESSION: 1. No acute intracranial abnormality. 2. Mild paranasal sinus inflammation. Electronically Signed   By: Narda Rutherford M.D.   On: 02/12/2022 16:02    Pertinent labs & imaging results that were available during my care of the patient were reviewed by me and considered in my medical decision making (see MDM for details).  Medications Ordered in ED Medications  ondansetron (ZOFRAN-ODT) disintegrating tablet 4 mg (4 mg Oral Given 02/12/22 1515)  Procedures Procedures  (including critical care time)  Medical Decision Making / ED Course   MDM:  46 year old male presenting to the emergency department with lightheadedness.  Patient overall well-appearing, exam including neurologic exam nonfocal without objective neurologic signs.  Sensation is intact in the left arm.  EKG with nonspecific ST changes which appear chronic but no STEMI.  Given this is similar to patient's previous presentation, will evaluate for ACS.  Will check troponin.  Doubt stroke with no neurologic deficit on exam.  CT head ordered in triage is negative.  Labs without sign of toxic or metabolic causes of lightheadedness.  No low blood pressure to suggest cause of lightheadedness.  Will reassess.  Clinical Course as of 02/12/22 Lawanna Kobus Feb 12, 2022  1922 Delta troponin negative.  Patient currently reports all of his symptoms have resolved.  He reports  that he never had chest pain during this episode.  His vital signs are reassuring.  Given his delta troponin is negative, his work-up is reassuring and he has no Ongoing symptoms, will discharge with expedited cardiology referral as outpatient.  Referral placed.  Given patient's history of coronary artery disease, should he develop any recurrent symptoms or should he develop chest pain, advised patient to return to the emergency department for further evaluation. [WS]    Clinical Course User Index [WS] Suezanne Jacquet, Jerilee Field, MD     Additional history obtained: -Additional history obtained from wife -External records from outside source obtained and reviewed including: Chart review including previous notes, labs, imaging, consultation notes   Lab Tests: -I ordered, reviewed, and interpreted labs.   The pertinent results include:   Labs Reviewed  CBC - Abnormal; Notable for the following components:      Result Value   WBC 11.4 (*)    All other components within normal limits  COMPREHENSIVE METABOLIC PANEL - Abnormal; Notable for the following components:   ALT 52 (*)    All other components within normal limits  CBG MONITORING, ED - Abnormal; Notable for the following components:   Glucose-Capillary 105 (*)    All other components within normal limits  PROTIME-INR  APTT  DIFFERENTIAL  ETHANOL  TROPONIN I (HIGH SENSITIVITY)  TROPONIN I (HIGH SENSITIVITY)      EKG   EKG Interpretation  Date/Time:  Monday February 12 2022 15:04:30 EDT Ventricular Rate:  106 PR Interval:  150 QRS Duration: 78 QT Interval:  334 QTC Calculation: 443 R Axis:   82 Text Interpretation: Sinus tachycardia Non specific ST abnormality When compared with ECG of 27-Nov-2019 09:23, No significant change was found Confirmed by Alvino Blood ([id number]) on 02/12/2022 3:30:50 PM         Imaging Studies ordered: I ordered imaging studies including CXR, CT head I independently visualized and interpreted  imaging. I agree with the radiologist interpretation   Medicines ordered and prescription drug management: Meds ordered this encounter  Medications  . ondansetron (ZOFRAN-ODT) disintegrating tablet 4 mg    -I have reviewed the patients home medicines and have made adjustments as needed     Cardiac Monitoring: The patient was maintained on a cardiac monitor.  I personally viewed and interpreted the cardiac monitored which showed an underlying rhythm of: NSR  Social Determinants of Health:  Factors impacting patients care include: history of smoking   Reevaluation: After the interventions noted above, I reevaluated the patient and found that they have :resolved  Co morbidities that complicate the patient evaluation . Past Medical History:  Diagnosis Date  . Bicuspid aortic valve 11/28/2019   R/L cusp fusion  . CAD (coronary artery disease) 11/30/2019   50% Diag2 & CFX, med rx  . Dyslipidemia (high LDL; low HDL) 11/30/2019  . HTN (hypertension)   . Sleep walking       Dispostion: Discharge     Final Clinical Impression(s) / ED Diagnoses Final diagnoses:  Lightheadedness     This chart was dictated using voice recognition software.  Despite best efforts to proofread,  errors can occur which can change the documentation meaning.    Cristie Hem, MD 02/12/22 [phone]

## 2022-02-12 NOTE — ED Notes (Signed)
Dizziness, headache. Not the first time, last time when his BP was elevated. Numbness left hand, also occurred last time as well.

## 2022-02-12 NOTE — Telephone Encounter (Signed)
Operator called into triage about this Dr. Anne Shutter pt.  Pts wife is calling.   Per the pts wife she is calling to report that ever since this morning, the pts BP/HR has been elevated and he is feeling flushed, feeling fatigued, hand is tingling, dizziness, and has a bad headache.   Wife states he took both his amlodipine and metoprolol this morning, and pressures still elevated.   In this message is his recorded BP/HR readings.   Wife is very concerned for the pt, for he also is not acting like himself and has very low energy.  She states his cheeks are super flushed.   Wife is concerned and thinks he needs immediate evaluation, but wanted to run this by our office first.   She states he denies any chest pain, sob, pre-syncope or syncope.  She states he is not slurring his speech and can walk with no difficulties.   Advised the pts wife that given his symptoms, continued elevated BP/HR readings, he should go to the ER for further evaluation.  Advised the pts wife she could take him to [facility] or [facility], if she is amenable to taking him to [facility] ER.  Wife states they live in [place], and states she will probably take him somewhere out there.   Wife verbalized understanding and agrees with this plan.  Wife states she agrees he needs immediate attention, and waiting to be seen outpatient would be to long in getting him treatment.  Wife thinks he may need an IV.   Wife will take him to the ER for further evaluation.  She is aware that I will route this message to Dr. Tenny Craw and her nurse in [place] as an FYI, to make them aware of this plan. Wife verbalized understanding and agrees with this plan.

## 2022-02-12 NOTE — Discharge Instructions (Addendum)
We evaluated you today in the emergency department for your lightheadedness.  Testing was reassuring including negative cardiac enzymes.  At this time, we did not think that you are having a heart attack.  Your symptoms improved in the emergency department.  Although your tests were reassuring, please follow-up very closely with cardiology as soon as possible.  Call your cardiologist for an appointment, we have also placed a referral for expedited follow-up.  Please return immediately to the emergency department if you develop any chest pain, difficulty breathing, fainting, vomiting, or any other concerning symptoms.

## 2022-02-12 NOTE — Telephone Encounter (Signed)
Pt c/o BP issue: STAT if pt c/o blurred vision, one-sided weakness or slurred speech  1. What are your last 5 BP readings?   171/97  HR 94 164/100  HR 99 150/102  HR 96 146/103  HR 89  2. Are you having any other symptoms (ex. Dizziness, headache, blurred vision, passed out)?   Patient's left hand has been like it's been asleep  3. What is your BP issue?    Wife called stating patient's face is kind of flushed.  Wife stated patient has been feeling hot even with air conditioning on.

## 2022-02-12 NOTE — ED Notes (Signed)
No trouble swallowing °

## 2022-02-12 NOTE — ED Notes (Signed)
Discharge paperwork given and verbally understood. 

## 2022-02-13 NOTE — Telephone Encounter (Signed)
Called pt spouse and offered 11:30 appt with Leda Gauze, PA-C- appt accepted.

## 2022-02-13 NOTE — Telephone Encounter (Signed)
Pt c/o BP issue: STAT if pt c/o blurred vision, one-sided weakness or slurred speech  1. What are your last 5 BP readings?   190/104   150/100 146/103 155/99 171/97 164/100  2. Are you having any other symptoms (ex. Dizziness, headache, blurred vision, passed out)?   Headache  3. What is your BP issue?   Wife called stating at first patient's left hand was tingling but he did not have any pains.  Wife stated the patient then started getting flushed, then had lightheadedness but still no chest pain and the patient felt really terrible.  Wife is concerned about the patient's high BP readings and was told in the ED that it may be related to his medication.  Wife would like the patient to see Dr. Tenny Craw or another cardiologist for a sooner appointment.

## 2022-02-14 ENCOUNTER — Encounter: Payer: Self-pay | Admitting: Physician Assistant

## 2022-02-14 ENCOUNTER — Ambulatory Visit: Payer: BC Managed Care – PPO | Admitting: Physician Assistant

## 2022-02-14 VITALS — BP 140/86 | HR 82 | Ht 71.0 in | Wt 198.8 lb

## 2022-02-14 DIAGNOSIS — E785 Hyperlipidemia, unspecified: Secondary | ICD-10-CM

## 2022-02-14 DIAGNOSIS — I2510 Atherosclerotic heart disease of native coronary artery without angina pectoris: Secondary | ICD-10-CM

## 2022-02-14 DIAGNOSIS — I10 Essential (primary) hypertension: Secondary | ICD-10-CM | POA: Diagnosis not present

## 2022-02-14 DIAGNOSIS — Q231 Congenital insufficiency of aortic valve: Secondary | ICD-10-CM | POA: Diagnosis not present

## 2022-02-14 DIAGNOSIS — Z720 Tobacco use: Secondary | ICD-10-CM

## 2022-02-14 MED ORDER — METOPROLOL SUCCINATE ER 25 MG PO TB24: 25.0000 mg | ORAL_TABLET | Freq: Every day | ORAL | 3 refills | Status: DC

## 2022-02-14 NOTE — Patient Instructions (Signed)
Medication Instructions:   Increase Toprol to 25 mg Daily   *If you need a refill on your cardiac medications before your next appointment, please call your pharmacy*   Lab Work: NONE   If you have labs (blood work) drawn today and your tests are completely normal, you will receive your results only by: MyChart Message (if you have MyChart) OR A paper copy in the mail If you have any lab test that is abnormal or we need to change your treatment, we will call you to review the results.   Testing/Procedures: None    Follow-Up: At [facility], you and your health needs are our priority.  As part of our continuing mission to provide you with exceptional heart care, we have created designated Provider Care Teams.  These Care Teams include your primary Cardiologist (physician) and Advanced Practice Providers (APPs -  Physician Assistants and Nurse Practitioners) who all work together to provide you with the care you need, when you need it.  We recommend signing up for the patient portal called "MyChart".  Sign up information is provided on this After Visit Summary.  MyChart is used to connect with patients for Virtual Visits (Telemedicine).  Patients are able to view lab/test results, encounter notes, upcoming appointments, etc.  Non-urgent messages can be sent to your provider as well.   To learn more about what you can do with MyChart, go to [email or web address].    Your next appointment:    As Scheduled   The format for your next appointment:   In Person  Provider:   You may see [name], MD or one of the following Advanced Practice Providers on your designated Care Team:   Randall An, PA-C  [name], PA-C     Other Instructions Thank you for choosing [facility]!   Decrease your Caffeine intake.   Please send a MyChart message on next week with your Blood Pressures.    Two Gram Sodium Diet 2000 mg  What is Sodium? Sodium is a mineral found  naturally in many foods. The most significant source of sodium in the diet is table salt, which is about 40% sodium.  Processed, convenience, and preserved foods also contain a large amount of sodium.  The body needs only 500 mg of sodium daily to function,  A normal diet provides more than enough sodium even if you do not use salt.  Why Limit Sodium? A build up of sodium in the body can cause thirst, increased blood pressure, shortness of breath, and water retention.  Decreasing sodium in the diet can reduce edema and risk of heart attack or stroke associated with high blood pressure.  Keep in mind that there are many other factors involved in these health problems.  Heredity, obesity, lack of exercise, cigarette smoking, stress and what you eat all play a role.  General Guidelines: Do not add salt at the table or in cooking.  One teaspoon of salt contains over 2 grams of sodium. Read food labels Avoid processed and convenience foods Ask your dietitian before eating any foods not dicussed in the menu planning guidelines Consult your physician if you wish to use a salt substitute or a sodium containing medication such as antacids.  Limit milk and milk products to 16 oz (2 cups) per day.  Shopping Hints: READ LABELS!! "Dietetic" does not necessarily mean low sodium. Salt and other sodium ingredients are often added to foods during processing.    Menu Planning Guidelines Food Group  Choose More Often Avoid  Beverages (see also the milk group All fruit juices, low-sodium, salt-free vegetables juices, low-sodium carbonated beverages Regular vegetable or tomato juices, commercially softened water used for drinking or cooking  Breads and Cereals Enriched white, wheat, rye and pumpernickel bread, hard rolls and dinner rolls; muffins, cornbread and waffles; most dry cereals, cooked cereal without added salt; unsalted crackers and breadsticks; low sodium or homemade bread crumbs Bread, rolls and crackers  with salted tops; quick breads; instant hot cereals; pancakes; commercial bread stuffing; self-rising flower and biscuit mixes; regular bread crumbs or cracker crumbs  Desserts and Sweets Desserts and sweets mad with mild should be within allowance Instant pudding mixes and cake mixes  Fats Butter or margarine; vegetable oils; unsalted salad dressings, regular salad dressings limited to 1 Tbs; light, sour and heavy cream Regular salad dressings containing bacon fat, bacon bits, and salt pork; snack dips made with instant soup mixes or processed cheese; salted nuts  Fruits Most fresh, frozen and canned fruits Fruits processed with salt or sodium-containing ingredient (some dried fruits are processed with sodium sulfites        Vegetables Fresh, frozen vegetables and low- sodium canned vegetables Regular canned vegetables, sauerkraut, pickled vegetables, and others prepared in brine; frozen vegetables in sauces; vegetables seasoned with ham, bacon or salt pork  Condiments, Sauces, Miscellaneous  Salt substitute with physician's approval; pepper, herbs, spices; vinegar, lemon or lime juice; hot pepper sauce; garlic powder, onion powder, low sodium soy sauce (1 Tbs.); low sodium condiments (ketchup, chili sauce, mustard) in limited amounts (1 tsp.) fresh ground horseradish; unsalted tortilla chips, pretzels, potato chips, popcorn, salsa (1/4 cup) Any seasoning made with salt including garlic salt, celery salt, onion salt, and seasoned salt; sea salt, rock salt, kosher salt; meat tenderizers; monosodium glutamate; mustard, regular soy sauce, barbecue, sauce, chili sauce, teriyaki sauce, steak sauce, Worcestershire sauce, and most flavored vinegars; canned gravy and mixes; regular condiments; salted snack foods, olives, picles, relish, horseradish sauce, catsup   Food preparation: Try these seasonings Meats:    Pork Sage, onion Serve with applesauce  Chicken Poultry seasoning, thyme, parsley Serve with  cranberry sauce  Lamb Curry powder, rosemary, garlic, thyme Serve with mint sauce or jelly  Veal Marjoram, basil Serve with current jelly, cranberry sauce  Beef Pepper, bay leaf Serve with dry mustard, unsalted chive butter  Fish [place] leaf, dill Serve with unsalted lemon butter, unsalted parsley butter  Vegetables:    Asparagus Lemon juice   Broccoli Lemon juice   Carrots Mustard dressing parsley, mint, nutmeg, glazed with unsalted butter and sugar   Green beans Marjoram, lemon juice, nutmeg,dill seed   Tomatoes Basil, marjoram, onion   Spice /blend for [employer]" 4 tsp ground thyme 1 tsp ground sage 3 tsp ground rosemary 4 tsp ground marjoram   Test your knowledge A product that says "Salt Free" may still contain sodium. True or False Garlic Powder and Hot Pepper Sauce an be used as alternative seasonings.True or False Processed foods have more sodium than fresh foods.  True or False Canned Vegetables have less sodium than froze True or False   WAYS TO DECREASE YOUR SODIUM INTAKE Avoid the use of added salt in cooking and at the table.  Table salt (and other prepared seasonings which contain salt) is probably one of the greatest sources of sodium in the diet.  Unsalted foods can gain flavor from the sweet, sour, and butter taste sensations of herbs and spices.  Instead of using  salt for seasoning, try the following seasonings with the foods listed.  Remember: how you use them to enhance natural food flavors is limited only by your creativity... Allspice-Meat, fish, eggs, fruit, peas, red and yellow vegetables Almond Extract-Fruit baked goods Anise Seed-Sweet breads, fruit, carrots, beets, cottage cheese, cookies (tastes like licorice) Basil-Meat, fish, eggs, vegetables, rice, vegetables salads, soups, sauces Bay Leaf-Meat, fish, stews, poultry Burnet-Salad, vegetables (cucumber-like flavor) Caraway Seed-Bread, cookies, cottage cheese, meat, vegetables, cheese, rice Cardamon-Baked  goods, fruit, soups Celery Powder or seed-Salads, salad dressings, sauces, meatloaf, soup, bread.Do not use  celery salt Chervil-Meats, salads, fish, eggs, vegetables, cottage cheese (parsley-like flavor) Chili Power-Meatloaf, chicken cheese, corn, eggplant, egg dishes Chives-Salads cottage cheese, egg dishes, soups, vegetables, sauces Cilantro-Salsa, casseroles Cinnamon-Baked goods, fruit, pork, lamb, chicken, carrots Cloves-Fruit, baked goods, fish, pot roast, green beans, beets, carrots Coriander-Pastry, cookies, meat, salads, cheese (lemon-orange flavor) Cumin-Meatloaf, fish,cheese, eggs, cabbage,fruit pie (caraway flavor) [employer], fruit, eggs, fish, poultry, cottage cheese, vegetables Dill Seed-Meat, cottage cheese, poultry, vegetables, fish, salads, bread Fennel Seed-Bread, cookies, apples, pork, eggs, fish, beets, cabbage, cheese, Licorice-like flavor Garlic-(buds or powder) Salads, meat, poultry, fish, bread, butter, vegetables, potatoes.Do not  use garlic salt Ginger-Fruit, vegetables, baked goods, meat, fish, poultry Horseradish Root-Meet, vegetables, butter Lemon Juice or Extract-Vegetables, fruit, tea, baked goods, fish salads Mace-Baked goods fruit, vegetables, fish, poultry (taste like nutmeg) Maple Extract-Syrups Marjoram-Meat, chicken, fish, vegetables, breads, green salads (taste like Sage) Mint-Tea, lamb, sherbet, vegetables, desserts, carrots, cabbage Mustard, Dry or Seed-Cheese, eggs, meats, vegetables, poultry Nutmeg-Baked goods, fruit, chicken, eggs, vegetables, desserts Onion Powder-Meat, fish, poultry, vegetables, cheese, eggs, bread, rice salads (Do not use   Onion salt) Orange Extract-Desserts, baked goods Oregano-Pasta, eggs, cheese, onions, pork, lamb, fish, chicken, vegetables, green salads Paprika-Meat, fish, poultry, eggs, cheese, vegetables Parsley Flakes-Butter, vegetables, meat fish, poultry, eggs, bread, salads (certain forms may   Contain  sodium Pepper-Meat fish, poultry, vegetables, eggs Peppermint Extract-Desserts, baked goods Poppy Seed-Eggs, bread, cheese, fruit dressings, baked goods, noodles, vegetables, cottage  [employer], poultry, meat, fish, cauliflower, turnips,eggs bread Saffron-Rice, bread, veal, chicken, fish, eggs Sage-Meat, fish, poultry, onions, eggplant, tomateos, pork, stews Savory-Eggs, salads, poultry, meat, rice, vegetables, soups, pork Tarragon-Meat, poultry, fish, eggs, butter, vegetables (licorice-like flavor)  Thyme-Meat, poultry, fish, eggs, vegetables, (clover-like flavor), sauces, soups Tumeric-Salads, butter, eggs, fish, rice, vegetables (saffron-like flavor) Vanilla Extract-Baked goods, candy Vinegar-Salads, vegetables, meat marinades Walnut Extract-baked goods, candy   2. Choose your Foods Wisely   The following is a list of foods to avoid which are high in sodium:  Meats-Avoid all smoked, canned, salt cured, dried and kosher meat and fish as well as Anchovies   [employer] meats:Bologna, Liverwurst, Pastrami Canned meat or fish  Marinated herring Caviar    Pepperoni Corned Beef   Pizza Dried chipped beef  Salami Frozen breaded fish or meat Salt pork Frankfurters or hot dogs  Sardines Gefilte fish   Sausage Ham (boiled ham, Proscuitto Smoked butt    spiced ham)   Spam      TV Dinners Vegetables Canned vegetables (Regular) Relish Canned mushrooms  Sauerkraut Olives    Tomato juice Pickles  Bakery and Dessert Products Canned puddings  Cream pies Cheesecake   Decorated cakes Cookies  Beverages/Juices Tomato juice, regular  Gatorade   V-8 vegetable juice, regular  Breads and Cereals Biscuit mixes   Salted potato chips, corn chips, pretzels Bread stuffing mixes  Salted crackers and rolls Pancake and waffle mixes Self-rising flour  Seasonings Accent    Meat sauces Barbecue sauce  Meat tenderizer Catsup    Monosodium  glutamate (MSG) Celery salt   Onion salt Chili sauce   Prepared mustard Garlic salt   Salt, seasoned salt, sea salt Gravy mixes   Soy sauce Horseradish   Steak sauce Ketchup   Tartar sauce Lite salt    Teriyaki sauce Marinade mixes   Worcestershire sauce  Others Baking powder   Cocoa and cocoa mixes Baking soda   Commercial casserole mixes Candy-caramels, chocolate  Dehydrated soups    Bars, fudge,nougats  Instant rice and pasta mixes Canned broth or soup  Maraschino cherries Cheese, aged and processed cheese and cheese spreads  Learning Assessment Quiz  Indicated T (for True) or F (for False) for each of the following statements:  _____ Fresh fruits and vegetables and unprocessed grains are generally low in sodium _____ Water may contain a considerable amount of sodium, depending on the source _____ You can always tell if a food is high in sodium by tasting it _____ Certain laxatives my be high in sodium and should be avoided unless prescribed   by a physician or pharmacist _____ Salt substitutes may be used freely by anyone on a sodium restricted diet _____ Sodium is present in table salt, food additives and as a natural component of   most foods _____ Table salt is approximately 90% sodium _____ Limiting sodium intake may help prevent excess fluid accumulation in the body _____ On a sodium-restricted diet, seasonings such as bouillon soy sauce, and    cooking wine should be used in place of table salt _____ On an ingredient list, a product which lists monosodium glutamate as the first   ingredient is an appropriate food to include on a low sodium diet  Circle the best answer(s) to the following statements (Hint: there may be more than one correct answer)  11. On a low-sodium diet, some acceptable snack items are:    A. Olives  F. Bean dip   K. Grapefruit juice    B. Salted Pretzels G. Commercial Popcorn   L. Canned peaches    C. Carrot Sticks  H. Bouillon   M. Unsalted  nuts   D. [place] fries  I. Peanut butter crackers N. Salami   E. Sweet pickles J. Tomato Juice   O. Pizza  12.  Seasonings that may be used freely on a reduced - sodium diet include   A. Lemon wedges F.Monosodium glutamate K. Celery seed    B.Soysauce   G. Pepper   L. Mustard powder   C. Sea salt  H. Cooking wine  M. Onion flakes   D. Vinegar  E. Prepared horseradish N. Salsa   E. Sage   J. Worcestershire sauce  O. Chutney   Important Information About Sugar

## 2022-02-14 NOTE — Progress Notes (Signed)
Cardiology Office Note    Date:  02/14/2022   ID:  Luis, Chang 24-Aug-1975, MRN [id number]   PCP:  [name], MD   [facility]  Cardiologist:  [name], MD   Advanced Practice Provider:  No care team member to display Electrophysiologist:  None   [phone]   No chief complaint on file.   History of Present Illness:  Luis Chang is a 46 y.o. male with a history of HTN, HL, tobacco use and CAD  In June 2021 he was admitted ith CP   Triv elevation of tropoinin  (38)   Underwent cardiac cath which showed 50% D2; 50% mLCx.  Echo also done which showed  bicuspid AoV with no AS, mild AI     Patient last saw Dr. Tenny Craw 11/2020 and was doing well.   He went to ED 02/12/22 with elevated BP's and lightheadedness. He called in yest with BP190/104,150/100, 146/103,155/99,171/97,164/100 and was added onto my schedule. Patient missed his meds last Friday. 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 Eats fast food or out daily for lunch. Drinks 2-3 cups coffee and 3 sodas(coke). Quit cigarette smoking in Jan but vaping low nicotine.    Past Medical History:  Diagnosis Date   Bicuspid aortic valve 11/28/2019   R/L cusp fusion   CAD (coronary artery disease) 11/30/2019   50% Diag2 & CFX, med rx   Dyslipidemia (high LDL; low HDL) 11/30/2019   HTN (hypertension)    Sleep walking     Past Surgical History:  Procedure Laterality Date   FOREIGN BODY REMOVAL  2007   RIGHT HAND   LEFT HEART CATH AND CORONARY ANGIOGRAPHY N/A 11/30/2019   Procedure: LEFT HEART CATH AND CORONARY ANGIOGRAPHY;  Surgeon: [name], MD;  Location: [facility];  Service: Cardiovascular;  Laterality: N/A;   WISDOM TOOTH EXTRACTION      Current Medications: Current Meds  Medication Sig   amLODipine (NORVASC) 5 MG tablet Take 1 tablet (5 mg total) by mouth daily.   aspirin EC 81 MG EC tablet Take 1 tablet (81 mg total) by mouth daily.   Esomeprazole Magnesium  (NEXIUM PO) Take 1 capsule by mouth daily in the afternoon.   metoprolol succinate (TOPROL XL) 25 MG 24 hr tablet Take 1 tablet (25 mg total) by mouth daily.   rosuvastatin (CRESTOR) 20 MG tablet TAKE 1 TABLET BY MOUTH EVERY DAY   [DISCONTINUED] metoprolol succinate (TOPROL-XL) 25 MG 24 hr tablet TAKE 1/2 TABLET BY MOUTH EVERY DAY     Allergies:   Patient has no known allergies.   Social History   Socioeconomic History   Marital status: Married    Spouse name: Not on file   Number of children: Not on file   Years of education: 12   Highest education level: Not on file  Occupational History    Employer: [employer]  Tobacco Use   Smoking status: Former    Packs/day: 2.00    Types: Cigarettes   Smokeless tobacco: Never  Vaping Use   Vaping Use: Every day   Substances: Nicotine, Flavoring  Substance and Sexual Activity   Alcohol use: Yes    Comment: OCCASIONALLY   Drug use: No   Sexual activity: Yes  Other Topics Concern   Not on file  Social History Narrative   Not on file   Social Determinants of Health   Financial Resource Strain: Not on file  Food Insecurity: Not on file  Transportation Needs: Not on file  Physical Activity: Not on file  Stress: Not on file  Social Connections: Not on file     Family History:  The patient's  family history includes Arthritis in an other family member; High blood pressure in his father and sister.   ROS:   Please see the history of present illness.    ROS All other systems reviewed and are negative.   PHYSICAL EXAM:   VS:  BP (!) 140/86 (BP Location: Left Arm, Patient Position: Sitting, Cuff Size: Large)   Pulse 82   Ht 5\' 11"  (1.803 m)   Wt 198 lb 12.8 oz (90.2 kg)   SpO2 97%   BMI 27.73 kg/m   Physical Exam  GEN: Well nourished, well developed, in no acute distress  Neck: no JVD, carotid bruits, or masses Cardiac:RRR; 1/6 systolic murmur LSB Respiratory:  clear to auscultation bilaterally, normal work of  breathing GI: soft, nontender, nondistended, + BS Ext: without cyanosis, clubbing, or edema, Good distal pulses bilaterally Neuro:  Alert and Oriented x 3 Psych: euthymic mood, full affect  Wt Readings from Last 3 Encounters:  02/14/22 198 lb 12.8 oz (90.2 kg)  02/12/22 200 lb (90.7 kg)  12/15/20 195 lb (88.5 kg)      Studies/Labs Reviewed:   EKG:  EKG is not ordered today.     Recent Labs: 02/12/2022: ALT 52; BUN 14; Creatinine, Ser 1.13; Hemoglobin 16.7; Platelets 291; Potassium 4.0; Sodium 137   Lipid Panel    Component Value Date/Time   CHOL 182 07/13/2020 1017   TRIG 116 07/13/2020 1017   HDL 48 07/13/2020 1017   CHOLHDL 3.8 07/13/2020 1017   VLDL 23 07/13/2020 1017   LDLCALC 111 (H) 07/13/2020 1017    Additional studies/ records that were reviewed today include:   CARDIAC CATH: 11/30/2019 Conclusion: Moderate two-vessel coronary artery disease with 50% lesions involving D2 and mid LCx.  No significant stenosis noted in the LAD or RCA. Normal left ventricular filling pressure.   Recommendations: Aggressive medical therapy and risk factor modifications to prevent progression of disease. Full results below   ECHO: 11/28/2019  1. Left ventricular ejection fraction, by estimation, is 50 to 55%. The left ventricle has low normal function. The left ventricle has no regional wall motion abnormalities. Left ventricular diastolic parameters were normal.   2. Right ventricular systolic function is normal. The right ventricular size is normal.   3. The mitral valve is myxomatous. No evidence of mitral valve regurgitation. No evidence of mitral stenosis.   4. Bicuspid valve - right and left cusp fusion. . The aortic valve is bicuspid. Aortic valve regurgitation is mild. Mild to moderate aortic valve sclerosis/calcification is present, without any evidence of aortic stenosis.   5. The inferior vena cava is normal in size with greater than 50% respiratory variability, suggesting  right atrial pressure of 3 mmHg.        Risk Assessment/Calculations:         ASSESSMENT:    1. Essential hypertension   2. Coronary artery disease involving native coronary artery of native heart without angina pectoris   3. Hyperlipidemia, unspecified hyperlipidemia type   4. Bicuspid aortic valve   5. Tobacco abuse      PLAN:  In order of problems listed above:  HTN BP running high recently as well as pulse, getting extra salt and caffeine in his diet. Discussed dietary and lifestyle changes in detail. Will increase  toprol xl 25 mg once daily(didn't tolerate when first started on this dose with fatigue and low HR's but willing to try since HR has been running 80-99). They will send BP readings in next week and can adjust if needed. Also has f/u 9/7   CAD cath 11/2019 50% D2; 50% mLCx. No chest pain  HLD on crestor-LDL 87 12/2020  Bicuspid AV with mild AI on echo 2021  Tobacco abuse -quit cigarettes and now vaping lower nicotine. I advised him about the side effects of vaping.    Shared Decision Making/Informed Consent        Medication Adjustments/Labs and Tests Ordered: Current medicines are reviewed at length with the patient today.  Concerns regarding medicines are outlined above.  Medication changes, Labs and Tests ordered today are listed in the Patient Instructions below. Patient Instructions  Medication Instructions:   Increase Toprol to 25 mg Daily   *If you need a refill on your cardiac medications before your next appointment, please call your pharmacy*   Lab Work: NONE   If you have labs (blood work) drawn today and your tests are completely normal, you will receive your results only by: [place] (if you have MyChart) OR A paper copy in the mail If you have any lab test that is abnormal or we need to change your treatment, we will call you to review the results.   Testing/Procedures: None    Follow-Up: At [facility], you and your  health needs are our priority.  As part of our continuing mission to provide you with exceptional heart care, we have created designated Provider Care Teams.  These Care Teams include your primary Cardiologist (physician) and Advanced Practice Providers (APPs -  Physician Assistants and Nurse Practitioners) who all work together to provide you with the care you need, when you need it.  We recommend signing up for the patient portal called "MyChart".  Sign up information is provided on this After Visit Summary.  MyChart is used to connect with patients for Virtual Visits (Telemedicine).  Patients are able to view lab/test results, encounter notes, upcoming appointments, etc.  Non-urgent messages can be sent to your provider as well.   To learn more about what you can do with MyChart, go to [email or web address].    Your next appointment:    As Scheduled   The format for your next appointment:   In Person  Provider:   You may see Dorris Carnes, MD or one of the following Advanced Practice Providers on your designated Care Team:   Bernerd Pho, PA-C  Ermalinda Barrios, PA-C     Other Instructions Thank you for choosing [facility]!   Decrease your Caffeine intake.   Please send a MyChart message on next week with your Blood Pressures.    Two Gram Sodium Diet 2000 mg  What is Sodium? Sodium is a mineral found naturally in many foods. The most significant source of sodium in the diet is table salt, which is about 40% sodium.  Processed, convenience, and preserved foods also contain a large amount of sodium.  The body needs only 500 mg of sodium daily to function,  A normal diet provides more than enough sodium even if you do not use salt.  Why Limit Sodium? A build up of sodium in the body can cause thirst, increased blood pressure, shortness of breath, and water retention.  Decreasing sodium in the diet can reduce edema and risk of heart attack or stroke associated with  high blood  pressure.  Keep in mind that there are many other factors involved in these health problems.  Heredity, obesity, lack of exercise, cigarette smoking, stress and what you eat all play a role.  General Guidelines: Do not add salt at the table or in cooking.  One teaspoon of salt contains over 2 grams of sodium. Read food labels Avoid processed and convenience foods Ask your dietitian before eating any foods not dicussed in the menu planning guidelines Consult your physician if you wish to use a salt substitute or a sodium containing medication such as antacids.  Limit milk and milk products to 16 oz (2 cups) per day.  Shopping Hints: READ LABELS!! "Dietetic" does not necessarily mean low sodium. Salt and other sodium ingredients are often added to foods during processing.    Menu Planning Guidelines Food Group Choose More Often Avoid  Beverages (see also the milk group All fruit juices, low-sodium, salt-free vegetables juices, low-sodium carbonated beverages Regular vegetable or tomato juices, commercially softened water used for drinking or cooking  Breads and Cereals Enriched white, wheat, rye and pumpernickel bread, hard rolls and dinner rolls; muffins, cornbread and waffles; most dry cereals, cooked cereal without added salt; unsalted crackers and breadsticks; low sodium or homemade bread crumbs Bread, rolls and crackers with salted tops; quick breads; instant hot cereals; pancakes; commercial bread stuffing; self-rising flower and biscuit mixes; regular bread crumbs or cracker crumbs  Desserts and Sweets Desserts and sweets mad with mild should be within allowance Instant pudding mixes and cake mixes  Fats Butter or margarine; vegetable oils; unsalted salad dressings, regular salad dressings limited to 1 Tbs; light, sour and heavy cream Regular salad dressings containing bacon fat, bacon bits, and salt pork; snack dips made with instant soup mixes or processed cheese; salted nuts  Fruits Most  fresh, frozen and canned fruits Fruits processed with salt or sodium-containing ingredient (some dried fruits are processed with sodium sulfites        Vegetables Fresh, frozen vegetables and low- sodium canned vegetables Regular canned vegetables, sauerkraut, pickled vegetables, and others prepared in brine; frozen vegetables in sauces; vegetables seasoned with ham, bacon or salt pork  Condiments, Sauces, Miscellaneous  Salt substitute with physician's approval; pepper, herbs, spices; vinegar, lemon or lime juice; hot pepper sauce; garlic powder, onion powder, low sodium soy sauce (1 Tbs.); low sodium condiments (ketchup, chili sauce, mustard) in limited amounts (1 tsp.) fresh ground horseradish; unsalted tortilla chips, pretzels, potato chips, popcorn, salsa (1/4 cup) Any seasoning made with salt including garlic salt, celery salt, onion salt, and seasoned salt; sea salt, rock salt, kosher salt; meat tenderizers; monosodium glutamate; mustard, regular soy sauce, barbecue, sauce, chili sauce, teriyaki sauce, steak sauce, Worcestershire sauce, and most flavored vinegars; canned gravy and mixes; regular condiments; salted snack foods, olives, picles, relish, horseradish sauce, catsup   Food preparation: Try these seasonings Meats:    Pork Sage, onion Serve with applesauce  Chicken Poultry seasoning, thyme, parsley Serve with cranberry sauce  Lamb Curry powder, rosemary, garlic, thyme Serve with mint sauce or jelly  Veal Marjoram, basil Serve with current jelly, cranberry sauce  Beef Pepper, bay leaf Serve with dry mustard, unsalted chive butter  Fish [place] leaf, dill Serve with unsalted lemon butter, unsalted parsley butter  Vegetables:    Asparagus Lemon juice   Broccoli Lemon juice   Carrots Mustard dressing parsley, mint, nutmeg, glazed with unsalted butter and sugar   Green beans Marjoram, lemon juice, nutmeg,dill seed  Tomatoes Basil, marjoram, onion   Spice /blend for [employer]" 4 tsp  ground thyme 1 tsp ground sage 3 tsp ground rosemary 4 tsp ground marjoram   Test your knowledge A product that says "Salt Free" may still contain sodium. True or False Garlic Powder and Hot Pepper Sauce an be used as alternative seasonings.True or False Processed foods have more sodium than fresh foods.  True or False Canned Vegetables have less sodium than froze True or False   WAYS TO DECREASE YOUR SODIUM INTAKE Avoid the use of added salt in cooking and at the table.  Table salt (and other prepared seasonings which contain salt) is probably one of the greatest sources of sodium in the diet.  Unsalted foods can gain flavor from the sweet, sour, and butter taste sensations of herbs and spices.  Instead of using salt for seasoning, try the following seasonings with the foods listed.  Remember: how you use them to enhance natural food flavors is limited only by your creativity... Allspice-Meat, fish, eggs, fruit, peas, red and yellow vegetables Almond Extract-Fruit baked goods Anise Seed-Sweet breads, fruit, carrots, beets, cottage cheese, cookies (tastes like licorice) Basil-Meat, fish, eggs, vegetables, rice, vegetables salads, soups, sauces Bay Leaf-Meat, fish, stews, poultry Burnet-Salad, vegetables (cucumber-like flavor) Caraway Seed-Bread, cookies, cottage cheese, meat, vegetables, cheese, rice Cardamon-Baked goods, fruit, soups Celery Powder or seed-Salads, salad dressings, sauces, meatloaf, soup, bread.Do not use  celery salt Chervil-Meats, salads, fish, eggs, vegetables, cottage cheese (parsley-like flavor) Chili Power-Meatloaf, chicken cheese, corn, eggplant, egg dishes Chives-Salads cottage cheese, egg dishes, soups, vegetables, sauces Cilantro-Salsa, casseroles Cinnamon-Baked goods, fruit, pork, lamb, chicken, carrots Cloves-Fruit, baked goods, fish, pot roast, green beans, beets, carrots Coriander-Pastry, cookies, meat, salads, cheese (lemon-orange flavor) Cumin-Meatloaf,  fish,cheese, eggs, cabbage,fruit pie (caraway flavor) [employer], fruit, eggs, fish, poultry, cottage cheese, vegetables Dill Seed-Meat, cottage cheese, poultry, vegetables, fish, salads, bread Fennel Seed-Bread, cookies, apples, pork, eggs, fish, beets, cabbage, cheese, Licorice-like flavor Garlic-(buds or powder) Salads, meat, poultry, fish, bread, butter, vegetables, potatoes.Do not  use garlic salt Ginger-Fruit, vegetables, baked goods, meat, fish, poultry Horseradish Root-Meet, vegetables, butter Lemon Juice or Extract-Vegetables, fruit, tea, baked goods, fish salads Mace-Baked goods fruit, vegetables, fish, poultry (taste like nutmeg) Maple Extract-Syrups Marjoram-Meat, chicken, fish, vegetables, breads, green salads (taste like Sage) Mint-Tea, lamb, sherbet, vegetables, desserts, carrots, cabbage Mustard, Dry or Seed-Cheese, eggs, meats, vegetables, poultry Nutmeg-Baked goods, fruit, chicken, eggs, vegetables, desserts Onion Powder-Meat, fish, poultry, vegetables, cheese, eggs, bread, rice salads (Do not use   Onion salt) Orange Extract-Desserts, baked goods Oregano-Pasta, eggs, cheese, onions, pork, lamb, fish, chicken, vegetables, green salads Paprika-Meat, fish, poultry, eggs, cheese, vegetables Parsley Flakes-Butter, vegetables, meat fish, poultry, eggs, bread, salads (certain forms may   Contain sodium Pepper-Meat fish, poultry, vegetables, eggs Peppermint Extract-Desserts, baked goods Poppy Seed-Eggs, bread, cheese, fruit dressings, baked goods, noodles, vegetables, cottage  [employer] Rx, poultry, meat, fish, cauliflower, turnips,eggs bread Saffron-Rice, bread, veal, chicken, fish, eggs Sage-Meat, fish, poultry, onions, eggplant, tomateos, pork, stews Savory-Eggs, salads, poultry, meat, rice, vegetables, soups, pork Tarragon-Meat, poultry, fish, eggs, butter, vegetables (licorice-like flavor)  Thyme-Meat, poultry, fish, eggs,  vegetables, (clover-like flavor), sauces, soups Tumeric-Salads, butter, eggs, fish, rice, vegetables (saffron-like flavor) Vanilla Extract-Baked goods, candy Vinegar-Salads, vegetables, meat marinades Walnut Extract-baked goods, candy   2. Choose your Foods Wisely   The following is a list of foods to avoid which are high in sodium:  Meats-Avoid all smoked, canned, salt cured, dried and kosher meat and fish as well as Anchovies  [employer] meats:Bologna, Liverwurst, Pastrami Canned meat or fish  Marinated herring Caviar    Pepperoni Corned Beef   Pizza Dried chipped beef  Salami Frozen breaded fish or meat Salt pork Frankfurters or hot dogs  Sardines Gefilte fish   Sausage Ham (boiled ham, Proscuitto Smoked butt    spiced ham)   Spam      TV Dinners Vegetables Canned vegetables (Regular) Relish Canned mushrooms  Sauerkraut Olives    Tomato juice Pickles  Bakery and Dessert Products Canned puddings  Cream pies Cheesecake   Decorated cakes Cookies  Beverages/Juices Tomato juice, regular  Gatorade   V-8 vegetable juice, regular  Breads and Cereals Biscuit mixes   Salted potato chips, corn chips, pretzels Bread stuffing mixes  Salted crackers and rolls Pancake and waffle mixes Self-rising flour  Seasonings Accent    Meat sauces Barbecue sauce  Meat tenderizer Catsup    Monosodium glutamate (MSG) Celery salt   Onion salt Chili sauce   Prepared mustard Garlic salt   Salt, seasoned salt, sea salt Gravy mixes   Soy sauce Horseradish   Steak sauce Ketchup   Tartar sauce Lite salt    Teriyaki sauce Marinade mixes   Worcestershire sauce  Others Baking powder   Cocoa and cocoa mixes Baking soda   Commercial casserole mixes Candy-caramels, chocolate  Dehydrated soups    Bars, fudge,nougats  Instant rice and pasta mixes Canned broth or soup  Maraschino cherries Cheese, aged and processed cheese and cheese spreads  Learning Assessment Quiz  Indicated T  (for True) or F (for False) for each of the following statements:  _____ Fresh fruits and vegetables and unprocessed grains are generally low in sodium _____ Water may contain a considerable amount of sodium, depending on the source _____ You can always tell if a food is high in sodium by tasting it _____ Certain laxatives my be high in sodium and should be avoided unless prescribed   by a physician or pharmacist _____ Salt substitutes may be used freely by anyone on a sodium restricted diet _____ Sodium is present in table salt, food additives and as a natural component of   most foods _____ Table salt is approximately 90% sodium _____ Limiting sodium intake may help prevent excess fluid accumulation in the body _____ On a sodium-restricted diet, seasonings such as bouillon soy sauce, and    cooking wine should be used in place of table salt _____ On an ingredient list, a product which lists monosodium glutamate as the first   ingredient is an appropriate food to include on a low sodium diet  Circle the best answer(s) to the following statements (Hint: there may be more than one correct answer)  11. On a low-sodium diet, some acceptable snack items are:    A. Olives  F. Bean dip   K. Grapefruit juice    B. Salted Pretzels G. Commercial Popcorn   L. Canned peaches    C. Carrot Sticks  H. Bouillon   M. Unsalted nuts   D. [place] fries  I. Peanut butter crackers N. Salami   E. Sweet pickles J. Tomato Juice   O. Pizza  12.  Seasonings that may be used freely on a reduced - sodium diet include   A. Lemon wedges F.Monosodium glutamate K. Celery seed    B.Soysauce   G. Pepper   L. Mustard powder   C. Sea salt  H. Cooking wine  M. Onion flakes   D.  Vinegar  E. Prepared horseradish N. Salsa   E. Sage   J. Worcestershire sauce  O. Chutney   Important Information About Sugar         Sumner Boast, PA-C  02/14/2022 12:04 PM    [facility] [address], [place], [place]  [address] Phone: [phone]; Fax: [phone]

## 2022-02-27 ENCOUNTER — Encounter: Payer: Self-pay | Admitting: Physician Assistant

## 2022-02-27 NOTE — Progress Notes (Signed)
Cardiology Office Note    Date:  03/01/2022   ID:  Luis Chang, DOB 04-21-1976, MRN [id number]  PCP:  [name], MD  Cardiologist:  [name], MD  Electrophysiologist:  None   Chief Complaint: f/u HTN  History of Present Illness:   Luis Chang is a 46 y.o. male with history of moderate nonobstructive CAD by cath 11/2019 (minimally elevated troponin), dyslipidemia, HTN, tobacco abuse/vaping, bicuspid AV by echo 2021 with mild AI by echo 08/2020 who is seen for follow-up. Prior cardiac studies reviewed below. He was recently seen in the ED 01/2022 with elevated BP and lightheadedness. When seen for follow-up by Luis Chang 02/14/22, Toprol was increased from 12.5mg  to 25mg  daily. She also advised him on lifestyle modification including reduction of caffeine, salt, vaping.   He is seen for follow-up today overall feeling well. He does sometimes feel "hot" but otherwise no CP, SOB, palpitations, weight loss. He has gained about 25lb over the last few years. His HR was borderline increased at today's visit initially at 107 similar to the ER visit, but improved to 92 by EKG. Wife reports home HR no higher than 90s, SBPs often in the 150s.He had prior fatigue on 25mg  dose of metoprolol but seems to be tolerating this well presently. He has cut down caffeine and sodium. Initial BP 132/84 by nurse, recheck 145/90 by me.    Labwork independently reviewed: 01/2022 trops neg, K 4.0, Cr 1.13, AST 33, ALT 52, WBC 11.4, Hgb 16.7, plt 291 12/2020 trig 220, LDL 87, Lp(a) 100  Cardiology Studies:   Studies reviewed are outlined and summarized above. Reports included below if pertinent.   2D echo 08/2020    1. Left ventricular ejection fraction, by estimation, is 60 to 65%. The  left ventricle has normal function. The left ventricle has no regional  wall motion abnormalities. Left ventricular diastolic parameters were  normal.   2. Right ventricular systolic function is normal. The right  ventricular  size is normal.   3. The mitral valve is normal in structure. No evidence of mitral valve  regurgitation. No evidence of mitral stenosis.   4. The aortic valve has an indeterminant number of cusps. There is mild  calcification of the aortic valve. There is mild thickening of the aortic  valve. Aortic valve regurgitation is mild. No aortic stenosis is present.   5. Aortic dilatation noted. There is mild dilatation of the aortic root,  measuring 40 mm.   6. The inferior vena cava is normal in size with greater than 50%  respiratory variability, suggesting right atrial pressure of 3 mmHg.   Cath 11/2019 Conclusion: Moderate two-vessel coronary artery disease with 50% lesions involving D2 and mid LCx.  No significant stenosis noted in the LAD or RCA. Normal left ventricular filling pressure.   Recommendations: Aggressive medical therapy and risk factor modifications to prevent progression of disease.   09/2020, MD [facility]    Past Medical History:  Diagnosis Date   Bicuspid aortic valve 11/28/2019   R/L cusp fusion   CAD (coronary artery disease) 11/30/2019   50% Diag2 & CFX, med rx   Dyslipidemia (high LDL; low HDL) 11/30/2019   History of nicotine vaping    HTN (hypertension)    Sleep walking    Tobacco abuse     Past Surgical History:  Procedure Laterality Date   FOREIGN BODY REMOVAL  2007   RIGHT HAND   LEFT HEART CATH AND CORONARY ANGIOGRAPHY N/A  11/30/2019   Procedure: LEFT HEART CATH AND CORONARY ANGIOGRAPHY;  Surgeon: [name], MD;  Location: [facility];  Service: Cardiovascular;  Laterality: N/A;   WISDOM TOOTH EXTRACTION      Current Medications: Current Meds  Medication Sig   acetaminophen (TYLENOL) 500 MG tablet Take 500 mg by mouth daily as needed for headache.   amLODipine (NORVASC) 5 MG tablet Take 1 tablet (5 mg total) by mouth daily.   aspirin EC 81 MG EC tablet Take 1 tablet (81 mg total) by mouth daily.    Esomeprazole Magnesium (NEXIUM PO) Take 1 capsule by mouth daily in the afternoon.   Ibuprofen 200 MG CAPS Take 1 capsule by mouth daily as needed (headache).   metoprolol succinate (TOPROL XL) 25 MG 24 hr tablet Take 1 tablet (25 mg total) by mouth daily.   nitroGLYCERIN (NITROSTAT) 0.4 MG SL tablet PLACE 1 TABLET UNDER THE TONGUE EVERY 5 (FIVE) MINUTES X 3 DOSES AS NEEDED FOR CHEST PAIN.   rosuvastatin (CRESTOR) 20 MG tablet TAKE 1 TABLET BY MOUTH EVERY DAY   sildenafil (VIAGRA) 50 MG tablet Take 1 tablet (50 mg total) by mouth daily as needed for erectile dysfunction.      Allergies:   Patient has no known allergies.   Social History   Socioeconomic History   Marital status: Married    Spouse name: Not on file   Number of children: Not on file   Years of education: 12   Highest education level: Not on file  Occupational History    Employer: [employer]  Tobacco Use   Smoking status: Former    Packs/day: 2.00    Types: Cigarettes   Smokeless tobacco: Never  Vaping Use   Vaping Use: Every day   Substances: Nicotine, Flavoring  Substance and Sexual Activity   Alcohol use: Yes    Comment: OCCASIONALLY   Drug use: No   Sexual activity: Yes  Other Topics Concern   Not on file  Social History Narrative   Not on file   Social Determinants of Health   Financial Resource Strain: Not on file  Food Insecurity: Not on file  Transportation Needs: Not on file  Physical Activity: Not on file  Stress: Not on file  Social Connections: Not on file     Family History:  The patient's family history includes Arthritis in an other family member; High blood pressure in his father and sister.  ROS:   Please see the history of present illness.  All other systems are reviewed and otherwise negative.    EKG(s)/Additional Labs   EKG:  EKG today sows NSR 92bpm, nonspecific STTW changes III, avF similar to prior.   Reviewed from recent tracing 02/12/22 borderline sinus  tach 106bpm with nonspecific STTW changes similar to 2022 (HR slightly increased)  Recent Labs: 02/12/2022: ALT 52; BUN 14; Creatinine, Ser 1.13; Hemoglobin 16.7; Platelets 291; Potassium 4.0; Sodium 137  Recent Lipid Panel    Component Value Date/Time   CHOL 182 07/13/2020 1017   TRIG 116 07/13/2020 1017   HDL 48 07/13/2020 1017   CHOLHDL 3.8 07/13/2020 1017   VLDL 23 07/13/2020 1017   LDLCALC 111 (H) 07/13/2020 1017    PHYSICAL EXAM:    VS:  BP 132/84   Pulse (!) 107   Ht 5\' 11"  (1.803 m)   Wt 198 lb 6.4 oz (90 kg)   SpO2 96%   BMI 27.67 kg/m   BMI: Body mass index is  27.67 kg/m.  GEN: Well nourished, well developed male in no acute distress HEENT: normocephalic, atraumatic Neck: no JVD, carotid bruits, or masses Cardiac: RRR; minimal SEM, no rubs or gallops, no edema  Respiratory:  clear to auscultation bilaterally, normal work of breathing GI: soft, nontender, nondistended, + BS MS: no deformity or atrophy Skin: warm and dry, no rash Neuro:  Alert and Oriented x 3, Strength and sensation are intact, follows commands Psych: euthymic mood, full affect  Wt Readings from Last 3 Encounters:  03/01/22 198 lb 6.4 oz (90 kg)  02/14/22 198 lb 12.8 oz (90.2 kg)  02/12/22 200 lb (90.7 kg)     ASSESSMENT & PLAN:   1. Essential HTN - suboptimally controlled. Increase amlodipine to 10mg  daily. The patient was provided instructions on monitoring blood pressure at home for 1 week and relaying results to our office. Would also recommend baseline TSH level. He will have this performed when he returns for fasting labs. I considered increasing metoprolol (more from HR standpoint than BP control) though he had fatigue with this in the past, so hold off on pushing dose further. We could consider ARB or chlorthalidone going forward if additional BP control is needed.  2. CAD - doing well without recent angina. Continue ASA. Discussed limiting NSAIDS due to bleeding risk, only takes  sparingly. Continue rosuvastatin - due for fasting lipids, will return for CMET/lipid profile. Continue metoprolol. Of note his HR was mildly elevated when he arrived, sinus tach, similar to recent ER visit, but improved back to the 90s resting. Recent Hgb was OK. He had issues with fatigue in the past with metoprolol but seems to be tolerating present dose. They request refills today of sildenafil and SL NTG as well. Discussed drug interaction and avoiding taking within 48 hours of the other and vice versa. He clarified he is only on Nexium, no longer on Protonix.  3. Hyperlipidemia - check CMET/lipid profile when he returns fasting.  4. Bicuspid AV with mild AI, dilated aortic root - noted to be bicuspid on echo 2021, with mild AI by echo 08/2020. Repeat echo would be due between 2025-2027. Given his dilated aortic root on last echo, will arrange screening CTA for baseline aortic size.  5. Leukocytosis - discussed recommendation to f/u PCP. Wife requests we add on to upcoming labs, will recheck CBC.    Disposition: F/u with me or 01-31-1976 in 3-4 months.   Medication Adjustments/Labs and Tests Ordered: Current medicines are reviewed at length with the patient today.  Concerns regarding medicines are outlined above. Medication changes, Labs and Tests ordered today are summarized above and listed in the Patient Instructions accessible in Encounters.    Signed, Luis Reedy, PA-C  03/01/2022 2:12 PM    [facility] - [place] Location in [facility] [address] [place], [place] Ph: [phone]; Fax [phone]

## 2022-03-01 ENCOUNTER — Ambulatory Visit: Payer: BC Managed Care – PPO | Attending: Physician Assistant | Admitting: Physician Assistant

## 2022-03-01 ENCOUNTER — Encounter: Payer: Self-pay | Admitting: Physician Assistant

## 2022-03-01 VITALS — BP 145/90 | HR 92 | Ht 71.0 in | Wt 198.4 lb

## 2022-03-01 DIAGNOSIS — I2510 Atherosclerotic heart disease of native coronary artery without angina pectoris: Secondary | ICD-10-CM

## 2022-03-01 DIAGNOSIS — E785 Hyperlipidemia, unspecified: Secondary | ICD-10-CM

## 2022-03-01 DIAGNOSIS — I351 Nonrheumatic aortic (valve) insufficiency: Secondary | ICD-10-CM

## 2022-03-01 DIAGNOSIS — D72829 Elevated white blood cell count, unspecified: Secondary | ICD-10-CM

## 2022-03-01 DIAGNOSIS — Q231 Congenital insufficiency of aortic valve: Secondary | ICD-10-CM | POA: Diagnosis not present

## 2022-03-01 DIAGNOSIS — Q2381 Bicuspid aortic valve: Secondary | ICD-10-CM

## 2022-03-01 DIAGNOSIS — I10 Essential (primary) hypertension: Secondary | ICD-10-CM

## 2022-03-01 DIAGNOSIS — I77810 Thoracic aortic ectasia: Secondary | ICD-10-CM

## 2022-03-01 MED ORDER — SILDENAFIL CITRATE 50 MG PO TABS: 50.0000 mg | ORAL_TABLET | Freq: Every day | ORAL | 2 refills | Status: DC | PRN

## 2022-03-01 MED ORDER — ROSUVASTATIN CALCIUM 20 MG PO TABS: 20.0000 mg | ORAL_TABLET | Freq: Every day | ORAL | 3 refills | Status: DC

## 2022-03-01 MED ORDER — METOPROLOL SUCCINATE ER 25 MG PO TB24: 25.0000 mg | ORAL_TABLET | Freq: Every day | ORAL | 3 refills | Status: DC

## 2022-03-01 MED ORDER — AMLODIPINE BESYLATE 10 MG PO TABS: 10.0000 mg | ORAL_TABLET | Freq: Every day | ORAL | 3 refills | Status: DC

## 2022-03-01 MED ORDER — NITROGLYCERIN 0.4 MG SL SUBL: SUBLINGUAL_TABLET | SUBLINGUAL | 6 refills | Status: DC

## 2022-03-01 NOTE — Patient Instructions (Addendum)
We need to get a better idea of what your blood pressure is running at home. Here are some instructions to follow: - I would recommend using a blood pressure cuff that goes on your arm. The wrist ones can be inaccurate. If you're purchasing one for the first time, try to select one that also reports your heart rate because this can be helpful information as well. - To check your blood pressure, choose a time at least 3 hours after taking your blood pressure medicines. If you can sample it at different times of the day, that's great - it might give you more information about how your blood pressure fluctuates. Remain seated in a chair for 5 minutes quietly beforehand, then check it.  - Please record a list of those readings and call us/send in MyChart message with them for our review in 1 week.  Medication Instructions:   Increase Amlodipine to 10 mg daily   *If you need a refill on your cardiac medications before your next appointment, please call your pharmacy*   Lab Work: Your physician recommends that you return for lab work in: Fasting   If you have labs (blood work) drawn today and your tests are completely normal, you will receive your results only by: MyChart Message (if you have MyChart) OR A paper copy in the mail If you have any lab test that is abnormal or we need to change your treatment, we will call you to review the results.   Testing/Procedures: Non-Cardiac CT Angiography (CTA), is a special type of CT scan that uses a computer to produce multi-dimensional views of major blood vessels throughout the body. In CT angiography, a contrast material is injected through an IV to help visualize the blood vessels    Follow-Up: At [facility], you and your health needs are our priority.  As part of our continuing mission to provide you with exceptional heart care, we have created designated Provider Care Teams.  These Care Teams include your primary Cardiologist (physician)  and Advanced Practice Providers (APPs -  Physician Assistants and Nurse Practitioners) who all work together to provide you with the care you need, when you need it.  We recommend signing up for the patient portal called "MyChart".  Sign up information is provided on this After Visit Summary.  MyChart is used to connect with patients for Virtual Visits (Telemedicine).  Patients are able to view lab/test results, encounter notes, upcoming appointments, etc.  Non-urgent messages can be sent to your provider as well.   To learn more about what you can do with MyChart, go to [email or web address].    Your next appointment:   3 month(s)  The format for your next appointment:   In Person  Provider:   [name], PA-C    Other Instructions Thank you for choosing [facility]!    Important Information About Sugar

## 2022-03-02 NOTE — Addendum Note (Signed)
Addended by: Kerney Elbe on: 03/02/2022 07:55 AM   Modules accepted: Orders

## 2022-03-06 ENCOUNTER — Other Ambulatory Visit (HOSPITAL_COMMUNITY)
Admission: RE | Admit: 2022-03-06 | Discharge: 2022-03-06 | Disposition: A | Discharge status: Home / Self Care | Payer: BC Managed Care – PPO | Source: Ambulatory Visit | Attending: Internal Medicine | Admitting: Internal Medicine

## 2022-03-06 DIAGNOSIS — I10 Essential (primary) hypertension: Secondary | ICD-10-CM | POA: Diagnosis not present

## 2022-03-06 DIAGNOSIS — E785 Hyperlipidemia, unspecified: Secondary | ICD-10-CM

## 2022-03-06 DIAGNOSIS — I2510 Atherosclerotic heart disease of native coronary artery without angina pectoris: Secondary | ICD-10-CM | POA: Diagnosis not present

## 2022-03-06 DIAGNOSIS — D72829 Elevated white blood cell count, unspecified: Secondary | ICD-10-CM

## 2022-03-06 LAB — COMPREHENSIVE METABOLIC PANEL
ALT: 54 U/L — ABNORMAL HIGH (ref 0–44)
AST: 35 U/L (ref 15–41)
Albumin: 4.6 g/dL (ref 3.5–5.0)
Alkaline Phosphatase: 75 U/L (ref 38–126)
Anion gap: 8 (ref 5–15)
BUN: 16 mg/dL (ref 6–20)
CO2: 25 mmol/L (ref 22–32)
Calcium: 9.6 mg/dL (ref 8.9–10.3)
Chloride: 106 mmol/L (ref 98–111)
Creatinine, Ser: 1.07 mg/dL (ref 0.61–1.24)
GFR, Estimated: >60 mL/min (ref >60)
Glucose, Bld: 113 mg/dL — ABNORMAL HIGH (ref 70–99)
Potassium: 4.3 mmol/L (ref 3.5–5.1)
Sodium: 139 mmol/L (ref 135–145)
Total Bilirubin: 0.9 mg/dL (ref 0.3–1.2)
Total Protein: 7.9 g/dL (ref 6.5–8.1)

## 2022-03-06 LAB — CBC
HCT: 47 % (ref 39.0–52.0)
Hemoglobin: 16.5 g/dL (ref 13.0–17.0)
MCH: 31.8 pg (ref 26.0–34.0)
MCHC: 35.1 g/dL (ref 30.0–36.0)
MCV: 90.6 fL (ref 80.0–100.0)
Platelets: 225 10*3/uL (ref 150–400)
RBC: 5.19 MIL/uL (ref 4.22–5.81)
RDW: 12.3 % (ref 11.5–15.5)
WBC: 7.4 10*3/uL (ref 4.0–10.5)
nRBC: 0 % (ref 0.0–0.2)

## 2022-03-06 LAB — LIPID PANEL
Cholesterol: 155 mg/dL (ref 0–200)
HDL: 56 mg/dL (ref >40)
LDL Cholesterol: 86 mg/dL (ref 0–99)
Total CHOL/HDL Ratio: 2.8 RATIO
Triglycerides: 66 mg/dL (ref <150)
VLDL: 13 mg/dL (ref 0–40)

## 2022-03-06 LAB — TSH: TSH: 1.044 u[IU]/mL (ref 0.350–4.500)

## 2022-04-04 ENCOUNTER — Telehealth: Payer: Self-pay | Admitting: Physician Assistant

## 2022-04-04 DIAGNOSIS — I77810 Thoracic aortic ectasia: Secondary | ICD-10-CM

## 2022-04-04 NOTE — Telephone Encounter (Addendum)
Had gotten a message from office staff that CTA study would be considered out of network in testing at [facility]. Dr. Harrington Challenger was looped in. She felt that the aorta was OK to follow with echo instead of CTA. The aortic root was mildly dilated by last study 08/2020. Given aortic size, he is still due for imaging per guidelines (12 months from last study) but OK to change to an echo instead. Please cancel CT and order echo for dilated aorta. Can you let patient know that we are discontinuing CT order and change to echo instead? He was also supposed to relay 1 week BP readings after last visit in September when we added amlodipine, do not see he reached out to the office. Particularly needs to let us know if BP still running higher than 793 systolic. Thanks!

## 2022-04-04 NOTE — Telephone Encounter (Signed)
Spoke to pt who verbalized understanding. Echo ordered and CT canceled.   Pt will send bp readings via MyChart for provider review as he did not have these on hand at the moment.

## 2022-04-08 ENCOUNTER — Other Ambulatory Visit: Payer: Self-pay | Admitting: Internal Medicine

## 2022-04-10 ENCOUNTER — Ambulatory Visit (HOSPITAL_COMMUNITY): Payer: BC Managed Care – PPO

## 2022-04-12 ENCOUNTER — Encounter: Payer: Self-pay | Admitting: Internal Medicine

## 2022-04-12 DIAGNOSIS — Z79899 Other long term (current) drug therapy: Secondary | ICD-10-CM

## 2022-04-12 DIAGNOSIS — I10 Essential (primary) hypertension: Secondary | ICD-10-CM

## 2022-04-12 MED ORDER — TRIAMTERENE-HCTZ 37.5-25 MG PO TABS: 0.5000 | ORAL_TABLET | Freq: Every day | ORAL | 3 refills | Status: DC

## 2022-04-12 NOTE — Telephone Encounter (Signed)
Try maxzide 37.5/25   start with 1/2 tab per day      Follow BP   Then get labs (BMET ) in 10 days

## 2022-04-25 ENCOUNTER — Other Ambulatory Visit (HOSPITAL_COMMUNITY)
Admission: RE | Admit: 2022-04-25 | Discharge: 2022-04-25 | Disposition: A | Discharge status: Home / Self Care | Payer: BC Managed Care – PPO | Source: Ambulatory Visit | Attending: Internal Medicine | Admitting: Internal Medicine

## 2022-04-25 DIAGNOSIS — Z79899 Other long term (current) drug therapy: Secondary | ICD-10-CM | POA: Insufficient documentation

## 2022-04-25 DIAGNOSIS — I10 Essential (primary) hypertension: Secondary | ICD-10-CM | POA: Insufficient documentation

## 2022-04-25 LAB — BASIC METABOLIC PANEL
Anion gap: 7 (ref 5–15)
BUN: 15 mg/dL (ref 6–20)
CO2: 25 mmol/L (ref 22–32)
Calcium: 9.3 mg/dL (ref 8.9–10.3)
Chloride: 104 mmol/L (ref 98–111)
Creatinine, Ser: 0.94 mg/dL (ref 0.61–1.24)
GFR, Estimated: >60 mL/min (ref >60)
Glucose, Bld: 102 mg/dL — ABNORMAL HIGH (ref 70–99)
Potassium: 3.6 mmol/L (ref 3.5–5.1)
Sodium: 136 mmol/L (ref 135–145)

## 2022-06-03 NOTE — Progress Notes (Unsigned)
Cardiology Office Note    Date:  06/04/2022   ID:  Luis Chang, DOB 11/11/1975, MRN [id number]  PCP:  [name], MD  Cardiologist:  [name], MD  Electrophysiologist:  None   Chief Complaint: f/u HTN  History of Present Illness:   Luis Chang is a 46 y.o. male with history of moderate nonobstructive CAD by cath 11/2019 (minimally elevated troponin), dyslipidemia, HTN, tobacco abuse/vaping, bicuspid AV by echo 2021 with mild AI by echo 08/2020 who is seen for follow-up. Prior cardiac studies reviewed below. He was recently seen in the ED 01/2022 with elevated BP and lightheadedness. When seen for follow-up by [name] 02/14/22, Toprol was increased from 12.5mg  to 25mg  daily. She also advised him on lifestyle modification including reduction of caffeine, salt, vaping.   He is seen for follow-up today overall feeling well. He does sometimes feel "hot" but otherwise no CP, SOB, palpitations, weight loss. He has gained about 25lb over the last few years. His HR was borderline increased at today's visit initially at 107 similar to the ER visit, but improved to 92 by EKG. Wife reports home HR no higher than 90s, SBPs often in the 150s.He had prior fatigue on 25mg  dose of metoprolol but seems to be tolerating this well presently. He has cut down caffeine and sodium. Initial BP 132/84 by nurse, recheck 145/90 by me.  The pt was last seen in clinic in Sept 2023 by independently reviewed: 01/2022 trops neg, K 4.0, Cr 1.13, AST 33, ALT 52, WBC 11.4, Hgb 16.7, plt 291 12/2020 trig 220, LDL 87, Lp(a) 100  Cardiology Studies:   Studies reviewed are outlined and summarized above. Reports included below if pertinent.   2D echo 08/2020    1. Left ventricular ejection fraction, by estimation, is 60 to 65%. The  left ventricle has normal function. The left ventricle has no regional  wall motion abnormalities. Left ventricular diastolic parameters were  normal.   2. Right  ventricular systolic function is normal. The right ventricular  size is normal.   3. The mitral valve is normal in structure. No evidence of mitral valve  regurgitation. No evidence of mitral stenosis.   4. The aortic valve has an indeterminant number of cusps. There is mild  calcification of the aortic valve. There is mild thickening of the aortic  valve. Aortic valve regurgitation is mild. No aortic stenosis is present.   5. Aortic dilatation noted. There is mild dilatation of the aortic root,  measuring 40 mm.   6. The inferior vena cava is normal in size with greater than 50%  respiratory variability, suggesting right atrial pressure of 3 mmHg.   Cath 11/2019 Conclusion: Moderate two-vessel coronary artery disease with 50% lesions involving D2 and mid LCx.  No significant stenosis noted in the LAD or RCA. Normal left ventricular filling pressure.   Recommendations: Aggressive medical therapy and risk factor modifications to prevent progression of disease.   09/2020, MD [facility]    Past Medical History:  Diagnosis Date   Bicuspid aortic valve 11/28/2019   R/L cusp fusion   CAD (coronary artery disease) 11/30/2019   50% Diag2 & CFX, med rx   Dyslipidemia (high LDL; low HDL) 11/30/2019   History of nicotine vaping    HTN (hypertension)    Sleep walking    Tobacco abuse     Past Surgical History:  Procedure Laterality Date   FOREIGN BODY REMOVAL  2007  RIGHT HAND   LEFT HEART CATH AND CORONARY ANGIOGRAPHY N/A 11/30/2019   Procedure: LEFT HEART CATH AND CORONARY ANGIOGRAPHY;  Surgeon: [name], MD;  Location: [facility] INVAS[facility] CV LAB;  Service: Cardiovascular;  Laterality: N/A;   WISDOM TOOTH EXTRACTION      Current Medications: Current Meds  Medication Sig   acetaminophen (TYLENOL) 500 MG tablet Take 500 mg by mouth daily as needed for headache.   amLODipine (NORVASC) 5 MG tablet Take 1 tablet (5 mg total) by mouth daily.   aspirin EC 81 MG EC tablet  Take 1 tablet (81 mg total) by mouth daily.   Esomeprazole Magnesium (NEXIUM PO) Take 1 capsule by mouth daily in the afternoon.   Ibuprofen 200 MG CAPS Take 1 capsule by mouth daily as needed (headache).   metoprolol succinate (TOPROL XL) 25 MG 24 hr tablet Take 1 tablet (25 mg total) by mouth daily.   nitroGLYCERIN (NITROSTAT) 0.4 MG SL tablet PLACE 1 TABLET UNDER THE TONGUE EVERY 5 (FIVE) MINUTES X 3 DOSES AS NEEDED FOR CHEST PAIN.   rosuvastatin (CRESTOR) 20 MG tablet TAKE 1 TABLET BY MOUTH EVERY DAY   sildenafil (VIAGRA) 50 MG tablet Take 1 tablet (50 mg total) by mouth daily as needed for erectile dysfunction.      Allergies:   Patient has no known allergies.   Social History   Socioeconomic History   Marital status: Married    Spouse name: Not on file   Number of children: Not on file   Years of education: 12   Highest education level: Not on file  Occupational History    Employer: [employer]  Tobacco Use   Smoking status: Former    Packs/day: 2.00    Types: Cigarettes   Smokeless tobacco: Never  Vaping Use   Vaping Use: Every day   Substances: Nicotine, Flavoring  Substance and Sexual Activity   Alcohol use: Yes    Comment: OCCASIONALLY   Drug use: No   Sexual activity: Yes  Other Topics Concern   Not on file  Social History Narrative   Not on file   Social Determinants of Health   Financial Resource Strain: Not on file  Food Insecurity: Not on file  Transportation Needs: Not on file  Physical Activity: Not on file  Stress: Not on file  Social Connections: Not on file     Family History:  The patient's family history includes Arthritis in an other family member; High blood pressure in his father and sister.  ROS:   Please see the history of present illness.  All other systems are reviewed and otherwise negative.    EKG(s)/Additional Labs   EKG:  EKG today sows NSR 92bpm, nonspecific STTW changes III, avF similar to prior.   Reviewed  from recent tracing 02/12/22 borderline sinus tach 106bpm with nonspecific STTW changes similar to 2022 (HR slightly increased)  Recent Labs: 03/06/2022: ALT 54; Hemoglobin 16.5; Platelets 225; TSH 1.044 04/25/2022: BUN 15; Creatinine, Ser 0.94; Potassium 3.6; Sodium 136  Recent Lipid Panel    Component Value Date/Time   CHOL 155 03/06/2022 0910   TRIG 66 03/06/2022 0910   HDL 56 03/06/2022 0910   CHOLHDL 2.8 03/06/2022 0910   VLDL 13 03/06/2022 0910   LDLCALC 86 03/06/2022 0910    PHYSICAL EXAM:    VS:  BP 124/82   Pulse 83   Ht 5\' 11"  (1.803 m)   Wt 201 lb 6.4 oz (91.4 kg)   SpO2 97%  BMI 28.09 kg/m   BMI: Body mass index is 28.09 kg/m.  GEN: Well nourished, well developed male in no acute distress HEENT: normocephalic, atraumatic Neck: no JVD, carotid bruits, or masses Cardiac: RRR; minimal SEM, no rubs or gallops, no edema  Respiratory:  clear to auscultation bilaterally, normal work of breathing GI: soft, nontender, nondistended, + BS MS: no deformity or atrophy Skin: warm and dry, no rash Neuro:  Alert and Oriented x 3, Strength and sensation are intact, follows commands Psych: euthymic mood, full affect  Wt Readings from Last 3 Encounters:  06/04/22 201 lb 6.4 oz (91.4 kg)  03/01/22 198 lb 6.4 oz (90 kg)  02/14/22 198 lb 12.8 oz (90.2 kg)     ASSESSMENT & PLAN:   1. Essential HTN - suboptimally controlled. Increase amlodipine to 10mg  daily. The patient was provided instructions on monitoring blood pressure at home for 1 week and relaying results to our office. Would also recommend baseline TSH level. He will have this performed when he returns for fasting labs. I considered increasing metoprolol (more from HR standpoint than BP control) though he had fatigue with this in the past, so hold off on pushing dose further. We could consider ARB or chlorthalidone going forward if additional BP control is needed.  2. CAD - doing well without recent angina. Continue ASA.  Discussed limiting NSAIDS due to bleeding risk, only takes sparingly. Continue rosuvastatin - due for fasting lipids, will return for CMET/lipid profile. Continue metoprolol. Of note his HR was mildly elevated when he arrived, sinus tach, similar to recent ER visit, but improved back to the 90s resting. Recent Hgb was OK. He had issues with fatigue in the past with metoprolol but seems to be tolerating present dose. They request refills today of sildenafil and SL NTG as well. Discussed drug interaction and avoiding taking within 48 hours of the other and vice versa. He clarified he is only on Nexium, no longer on Protonix.  3. Hyperlipidemia - check CMET/lipid profile when he returns fasting.  4. Bicuspid AV with mild AI, dilated aortic root - noted to be bicuspid on echo 2021, with mild AI by echo 08/2020. Repeat echo would be due between 2025-2027. Given his dilated aortic root on last echo, will arrange screening CTA for baseline aortic size.  5. Leukocytosis - discussed recommendation to f/u PCP. Wife requests we add on to upcoming labs, will recheck CBC.    Disposition: F/u with me or 01-31-1976 in 3-4 months.   Medication Adjustments/Labs and Tests Ordered: Current medicines are reviewed at length with the patient today.  Concerns regarding medicines are outlined above. Medication changes, Labs and Tests ordered today are summarized above and listed in the Patient Instructions accessible in Encounters.    Signed, [name], MD  06/04/2022 10:48 AM    [facility] - [place] Location in [facility] [address] [place], [place] Ph: [phone]; Fax [phone]

## 2022-06-04 ENCOUNTER — Ambulatory Visit: Payer: BC Managed Care – PPO | Attending: Internal Medicine | Admitting: Internal Medicine

## 2022-06-04 ENCOUNTER — Encounter: Payer: Self-pay | Admitting: Internal Medicine

## 2022-06-04 VITALS — BP 124/82 | HR 83 | Ht 71.0 in | Wt 201.4 lb

## 2022-06-04 DIAGNOSIS — Z79899 Other long term (current) drug therapy: Secondary | ICD-10-CM

## 2022-06-04 DIAGNOSIS — E785 Hyperlipidemia, unspecified: Secondary | ICD-10-CM

## 2022-06-04 MED ORDER — ROSUVASTATIN CALCIUM 40 MG PO TABS: 40.0000 mg | ORAL_TABLET | Freq: Every day | ORAL | 3 refills | Status: DC

## 2022-06-04 NOTE — Patient Instructions (Signed)
Medication Instructions:  Increase Crestor to 40 mg daily  *If you need a refill on your cardiac medications before your next appointment, please call your pharmacy*   Lab Work: Nmr, apo b, hepatic, hgba1c in 6-8 weeks  If you have labs (blood work) drawn today and your tests are completely normal, you will receive your results only by: MyChart Message (if you have MyChart) OR A paper copy in the mail If you have any lab test that is abnormal or we need to change your treatment, we will call you to review the results.   Testing/Procedures:    Follow-Up: At [facility], you and your health needs are our priority.  As part of our continuing mission to provide you with exceptional heart care, we have created designated Provider Care Teams.  These Care Teams include your primary Cardiologist (physician) and Advanced Practice Providers (APPs -  Physician Assistants and Nurse Practitioners) who all work together to provide you with the care you need, when you need it.  We recommend signing up for the patient portal called "MyChart".  Sign up information is provided on this After Visit Summary.  MyChart is used to connect with patients for Virtual Visits (Telemedicine).  Patients are able to view lab/test results, encounter notes, upcoming appointments, etc.  Non-urgent messages can be sent to your provider as well.   To learn more about what you can do with MyChart, go to [email or web address].    Your next appointment:   9 month(s)  The format for your next appointment:   In Person  Provider:   [name], MD     Other Instructions   Important Information About Sugar

## 2022-07-03 ENCOUNTER — Encounter: Payer: Self-pay | Admitting: Internal Medicine

## 2022-07-04 NOTE — Telephone Encounter (Signed)
Spoke with wife    Primary care should be the contact person for this Rx He has been feeling sick since Sunday (aches, low grade fever mainly)  I am not convinced Paxlovid would hasten clearance

## 2022-12-11 DIAGNOSIS — M25511 Pain in right shoulder: Secondary | ICD-10-CM | POA: Diagnosis not present

## 2023-01-26 ENCOUNTER — Other Ambulatory Visit: Payer: Self-pay | Admitting: Internal Medicine

## 2023-02-22 ENCOUNTER — Other Ambulatory Visit: Payer: Self-pay | Admitting: Physician Assistant

## 2023-03-02 ENCOUNTER — Other Ambulatory Visit: Payer: Self-pay | Admitting: Physician Assistant

## 2023-03-04 NOTE — Telephone Encounter (Signed)
This is a [place] pt.  °

## 2023-04-03 NOTE — Progress Notes (Deleted)
Cardiology Office Note    Date:  04/03/2023   ID:  LOWEN BARRINGER, DOB Oct 15, 1975, MRN [id number]  PCP:  [name], MD  Cardiologist:  [name], MD  Electrophysiologist:  None   Chief Complaint: f/u HTN  History of Present Illness:   Luis Chang is a 47 y.o. male with history of moderate nonobstructive CAD by cath 11/2019 (minimally elevated troponin), dyslipidemia, HTN, tobacco abuse/vaping, bicuspid AV by echo 2021 with mild AI by echo 08/2020 .  He was seen in the ED 01/2022 with elevated BP and lightheadedness. When seen for follow-up by Luis Chang 02/14/22, Toprol was increased from 12.5mg  to 25mg  daily. She also advised him on lifestyle modification including reduction of caffeine, salt, vaping.   Last seen by Luis Chang in clinic  in September 2023   He complained of feeling hot,  Noted HR was higher   Denied CP   Labwork done   ALT and glucose mildly elevated   REcomm he gconsider a RUQ USN LDL not at goal, did not change statin due to ALT,  Since seen the pt denies CP   Here with wife .     Trying to cut back on EtOH   Questions best type of EtOH     I saw the pt in clinic in Dec 2023     Labwork independently reviewed: 01/2022 trops neg, K 4.0, Cr 1.13, AST 33, ALT 52, WBC 11.4, Hgb 16.7, plt 291 12/2020 trig 220, LDL 87, Lp(a) 100  Cardiology Studies:   Studies reviewed are outlined and summarized above. Reports included below if pertinent.   2D echo 08/2020    1. Left ventricular ejection fraction, by estimation, is 60 to 65%. The  left ventricle has normal function. The left ventricle has no regional  wall motion abnormalities. Left ventricular diastolic parameters were  normal.   2. Right ventricular systolic function is normal. The right ventricular  size is normal.   3. The mitral valve is normal in structure. No evidence of mitral valve  regurgitation. No evidence of mitral stenosis.   4. The aortic valve has an indeterminant number of cusps. There is mild   calcification of the aortic valve. There is mild thickening of the aortic  valve. Aortic valve regurgitation is mild. No aortic stenosis is present.   5. Aortic dilatation noted. There is mild dilatation of the aortic root,  measuring 40 mm.   6. The inferior vena cava is normal in size with greater than 50%  respiratory variability, suggesting right atrial pressure of 3 mmHg.   Cath 11/2019 Conclusion: Moderate two-vessel coronary artery disease with 50% lesions involving D2 and mid LCx.  No significant stenosis noted in the LAD or RCA. Normal left ventricular filling pressure.   Recommendations: Aggressive medical therapy and risk factor modifications to prevent progression of disease.   Luis Kendall, MD [facility]    Past Medical History:  Diagnosis Date   Bicuspid aortic valve 11/28/2019   R/L cusp fusion   CAD (coronary artery disease) 11/30/2019   50% Diag2 & CFX, med rx   Dyslipidemia (high LDL; low HDL) 11/30/2019   History of nicotine vaping    HTN (hypertension)    Sleep walking    Tobacco abuse     Past Surgical History:  Procedure Laterality Date   FOREIGN BODY REMOVAL  2007   RIGHT HAND   LEFT HEART CATH AND CORONARY ANGIOGRAPHY N/A 11/30/2019   Procedure: LEFT HEART CATH  AND CORONARY ANGIOGRAPHY;  Surgeon: Luis Kendall, MD;  [facility];  Service: Cardiovascular;  Laterality: N/A;   WISDOM TOOTH EXTRACTION      Current Medications: Current Meds  Medication Sig   acetaminophen (TYLENOL) 500 MG tablet Take 500 mg by mouth daily as needed for headache.   amLODipine (NORVASC) 5 MG tablet Take 1 tablet (5 mg total) by mouth daily.   aspirin EC 81 MG EC tablet Take 1 tablet (81 mg total) by mouth daily.   Esomeprazole Magnesium (NEXIUM PO) Take 1 capsule by mouth daily in the afternoon.   Ibuprofen 200 MG CAPS Take 1 capsule by mouth daily as needed (headache).   metoprolol succinate (TOPROL XL) 25 MG 24 hr tablet Take 1 tablet (25  mg total) by mouth daily.   nitroGLYCERIN (NITROSTAT) 0.4 MG SL tablet PLACE 1 TABLET UNDER THE TONGUE EVERY 5 (FIVE) MINUTES X 3 DOSES AS NEEDED FOR CHEST PAIN.   rosuvastatin (CRESTOR) 20 MG tablet TAKE 1 TABLET BY MOUTH EVERY DAY   sildenafil (VIAGRA) 50 MG tablet Take 1 tablet (50 mg total) by mouth daily as needed for erectile dysfunction.      Allergies:   Patient has no known allergies.   Social History   Socioeconomic History   Marital status: Married    Spouse name: Not on file   Number of children: Not on file   Years of education: 12   Highest education level: Not on file  Occupational History    Employer: [employer]  Tobacco Use   Smoking status: Former    Current packs/day: 2.00    Types: Cigarettes   Smokeless tobacco: Never  Vaping Use   Vaping status: Every Day   Substances: Nicotine, Flavoring  Substance and Sexual Activity   Alcohol use: Yes    Comment: OCCASIONALLY   Drug use: No   Sexual activity: Yes  Other Topics Concern   Not on file  Social History Narrative   Not on file   Social Determinants of Health   Financial Resource Strain: Not on file  Food Insecurity: Not on file  Transportation Needs: Not on file  Physical Activity: Not on file  Stress: Not on file  Social Connections: Not on file     Family History:  The patient's family history includes Arthritis in an other family member; High blood pressure in his father and sister.  ROS:   Please see the history of present illness.  All other systems are reviewed and otherwise negative.    EKG(s)/Additional Labs   EKG:  EKG not ordered today     Recent Labs: 04/25/2022: BUN 15; Creatinine, Ser 0.94; Potassium 3.6; Sodium 136  Recent Lipid Panel    Component Value Date/Time   CHOL 155 03/06/2022 0910   TRIG 66 03/06/2022 0910   HDL 56 03/06/2022 0910   CHOLHDL 2.8 03/06/2022 0910   VLDL 13 03/06/2022 0910   LDLCALC 86 03/06/2022 0910    PHYSICAL EXAM:    VS:   There were no vitals taken for this visit.  BMI: There is no height or weight on file to calculate BMI.  GEN: Well nourished, well developed male in no acute distress HEENT: normocephalic, atraumatic Neck: no JVD, carotid bruits, or masses Cardiac: RRR;   No signficant murmurs    No LE  o edema  Respiratory:  clear to auscultation bilaterally, GI: soft, nontender, nondistended, + BS MS: no deformity or atrophy Skin: warm and dry,  no rash Neuro:  Alert and Oriented x 3, Strength and sensation are intact, follows commands Psych: euthymic mood, full affect  Wt Readings from Last 3 Encounters:  06/04/22 201 lb 6.4 oz (91.4 kg)  03/01/22 198 lb 6.4 oz (90 kg)  02/14/22 198 lb 12.8 oz (90.2 kg)     ASSESSMENT & PLAN:   1  Abnormal ALT    Most likely due to diet and alcohol   Reviewed with pt     Limit EtOH to 2 1.5 oz EtOH per day or 2 beers or 2 glasses wine.  ALso needs to cut back on carbs, processed food     Would repeat liver panel in a few months before scheduling USN  2  CAD  Doing well   Keep on current regimen   3  Lipids   LDL is still not at goal   Disucssed diet   I would increase rosuvastatin to 40 mg per day    4. Bicuspid AV with mild AI, dilated aortic root - noted to be bicuspid on echo 2021, with mild AI by echo 08/2020.  Aortic root 40   Follow for now with serial echoes     Disposition: F/u lipomed, ApoB, liver panel and A1C in 8 wks        Follow up in clinic in 9 months Medication Adjustments/Labs and Tests Ordered: Current medicines are reviewed at length with the patient today.  Concerns regarding medicines are outlined above. Medication changes, Labs and Tests ordered today are summarized above and listed in the Patient Instructions accessible in Encounters.    Signed, [name], MD  04/03/2023 3:16 PM    [facility] - [place] Location in [facility] [address] [place], [place] [address] Ph: [phone]; Fax [phone]

## 2023-04-04 ENCOUNTER — Ambulatory Visit: Payer: BC Managed Care – PPO | Admitting: Internal Medicine

## 2023-05-24 ENCOUNTER — Other Ambulatory Visit: Payer: Self-pay | Admitting: Physician Assistant

## 2023-06-20 ENCOUNTER — Other Ambulatory Visit: Payer: Self-pay | Admitting: Internal Medicine

## 2023-07-10 NOTE — Progress Notes (Signed)
Cardiology Office Note    Date:  07/11/2023   ID:  Luis Chang, DOB 02-03-1976, MRN [id number]  PCP:  [name], MD  Cardiologist:  [name], MD  Electrophysiologist:  None   Chief Complaint: f/u HTN  History of Present Illness:   Luis Chang is a 48 y.o. male with history of moderate nonobstructive CAD by cath 11/2019 (minimally elevated troponin), dyslipidemia, HTN, tobacco abuse/vaping, bicuspid AV by echo 2021 with mild AI by echo 08/2020 .  He was seen in the ED 01/2022 with elevated BP and lightheadedness.  Meds adjusted as outpt  I saw the patient in Dec 2023   he comes in today with his wife  Since seen he says his breathing is good   He denies CP  no dizziness   No palpitations       BP at home in 130s/ Drinking some EtOH   To counter he is drinking a few sodas (SSB) per day     Labwork independently reviewed: 01/2022 trops neg, K 4.0, Cr 1.13, AST 33, ALT 52, WBC 11.4, Hgb 16.7, plt 291 12/2020 trig 220, LDL 87, Lp(a) 100  Cardiology Studies:   Studies reviewed are outlined and summarized above. Reports included below if pertinent.   2D echo 08/2020    1. Left ventricular ejection fraction, by estimation, is 60 to 65%. The  left ventricle has normal function. The left ventricle has no regional  wall motion abnormalities. Left ventricular diastolic parameters were  normal.   2. Right ventricular systolic function is normal. The right ventricular  size is normal.   3. The mitral valve is normal in structure. No evidence of mitral valve  regurgitation. No evidence of mitral stenosis.   4. The aortic valve has an indeterminant number of cusps. There is mild  calcification of the aortic valve. There is mild thickening of the aortic  valve. Aortic valve regurgitation is mild. No aortic stenosis is present.   5. Aortic dilatation noted. There is mild dilatation of the aortic root,  measuring 40 mm.   6. The inferior vena cava is normal in size with greater than  50%  respiratory variability, suggesting right atrial pressure of 3 mmHg.   Cath 11/2019 Conclusion: Moderate two-vessel coronary artery disease with 50% lesions involving D2 and mid LCx.  No significant stenosis noted in the LAD or RCA. Normal left ventricular filling pressure.   Recommendations: Aggressive medical therapy and risk factor modifications to prevent progression of disease.   [name], MD [facility]    Past Medical History:  Diagnosis Date   Bicuspid aortic valve 11/28/2019   R/L cusp fusion   CAD (coronary artery disease) 11/30/2019   50% Diag2 & CFX, med rx   Dyslipidemia (high LDL; low HDL) 11/30/2019   History of nicotine vaping    HTN (hypertension)    Sleep walking    Tobacco abuse     Past Surgical History:  Procedure Laterality Date   FOREIGN BODY REMOVAL  2007   RIGHT HAND   LEFT HEART CATH AND CORONARY ANGIOGRAPHY N/A 11/30/2019   Procedure: LEFT HEART CATH AND CORONARY ANGIOGRAPHY;  Surgeon: [name], MD;  Location: [facility];  Service: Cardiovascular;  Laterality: N/A;   WISDOM TOOTH EXTRACTION      Current Medications: Current Meds  Medication Sig   acetaminophen (TYLENOL) 500 MG tablet Take 500 mg by mouth daily as needed for headache.   amLODipine (NORVASC) 5 MG tablet  Take 1 tablet (5 mg total) by mouth daily.   aspirin EC 81 MG EC tablet Take 1 tablet (81 mg total) by mouth daily.   Esomeprazole Magnesium (NEXIUM PO) Take 1 capsule by mouth daily in the afternoon.   Ibuprofen 200 MG CAPS Take 1 capsule by mouth daily as needed (headache).   metoprolol succinate (TOPROL XL) 25 MG 24 hr tablet Take 1 tablet (25 mg total) by mouth daily.   nitroGLYCERIN (NITROSTAT) 0.4 MG SL tablet PLACE 1 TABLET UNDER THE TONGUE EVERY 5 (FIVE) MINUTES X 3 DOSES AS NEEDED FOR CHEST PAIN.   rosuvastatin (CRESTOR) 20 MG tablet TAKE 1 TABLET BY MOUTH EVERY DAY   sildenafil (VIAGRA) 50 MG tablet Take 1 tablet (50 mg total) by mouth daily  as needed for erectile dysfunction.      Allergies:   Patient has no known allergies.   Social History   Socioeconomic History   Marital status: Married    Spouse name: Not on file   Number of children: Not on file   Years of education: 12   Highest education level: Not on file  Occupational History    Employer: [employer]  Tobacco Use   Smoking status: Former    Current packs/day: 2.00    Types: Cigarettes   Smokeless tobacco: Never  Vaping Use   Vaping status: Every Day   Substances: Nicotine, Flavoring  Substance and Sexual Activity   Alcohol use: Yes    Comment: OCCASIONALLY   Drug use: No   Sexual activity: Yes  Other Topics Concern   Not on file  Social History Narrative   Not on file   Social Drivers of Health   Financial Resource Strain: Not on file  Food Insecurity: Not on file  Transportation Needs: Not on file  Physical Activity: Not on file  Stress: Not on file  Social Connections: Not on file     Family History:  The patient's family history includes Arthritis in an other family member; High blood pressure in his father and sister.  ROS:   Please see the history of present illness.  All other systems are reviewed and otherwise negative.    EKG(s)/Additional Labs   EKG:  EKG shows SR 74 bpm   Recent Labs: 07/11/2023: ALT 46; BUN 12; Creatinine, Ser 0.93; Hemoglobin 16.8; Platelets 250; Potassium 4.1; Sodium 135  Recent Lipid Panel    Component Value Date/Time   CHOL 155 03/06/2022 0910   TRIG 66 03/06/2022 0910   HDL 56 03/06/2022 0910   CHOLHDL 2.8 03/06/2022 0910   VLDL 13 03/06/2022 0910   LDLCALC 86 03/06/2022 0910    PHYSICAL EXAM:    VS:  BP 136/84   Pulse 74   Ht 5\' 11"  (1.803 m)   Wt 202 lb (91.6 kg)   SpO2 98%   BMI 28.17 kg/m   BMI: Body mass index is 28.17 kg/m.  GEN: Pt In NAD  HEENT: normocephalic, atraumatic Neck:  JVP is normal   No bruits  Cardiac: RRR;   No signficant murmurs    No LE  edema Respiratory:  clear to auscultation  GI: soft, nontender, nondistended, + BS  Wt Readings from Last 3 Encounters:  07/11/23 202 lb (91.6 kg)  06/04/22 201 lb 6.4 oz (91.4 kg)  03/01/22 198 lb 6.4 oz (90 kg)     ASSESSMENT & PLAN:   1  HTN   BP is mildly increased  Sounds like it is also  about this level at home     Will get labs  before adjusting dosing    Goal 110s to 120s   2   CAD   No symptoms of angina    Follow   3  HL  Will get lipomed   4  Bicuspid AV  Last echo in 2022   No murmur on exam   Aorta was 40  Will follow periodically over time  5  Metabolic    Discussed SSB  (using to cut back on EtOH  )  WIll check A1C and CMET  6  EtOH  Using less that previous which is good   Follow up in clinic in late summer to reassess BP control   Medication Adjustments/Labs and Tests Ordered: Current medicines are reviewed at length with the patient today.  Concerns regarding medicines are outlined above. Medication changes, Labs and Tests ordered today are summarized above and listed in the Patient Instructions accessible in Encounters.    Signed, [name], MD  07/11/2023 12:40 PM    [facility] - [place] Location in [facility] [address] [place], [place] [address] Ph: [phone]; Fax [phone]

## 2023-07-11 ENCOUNTER — Ambulatory Visit: Payer: BC Managed Care – PPO | Attending: Internal Medicine | Admitting: Internal Medicine

## 2023-07-11 ENCOUNTER — Other Ambulatory Visit (HOSPITAL_COMMUNITY)
Admission: RE | Admit: 2023-07-11 | Discharge: 2023-07-11 | Disposition: A | Discharge status: Home / Self Care | Payer: BC Managed Care – PPO | Attending: Internal Medicine | Admitting: Internal Medicine

## 2023-07-11 ENCOUNTER — Encounter: Payer: Self-pay | Admitting: Internal Medicine

## 2023-07-11 VITALS — BP 136/84 | HR 74 | Ht 71.0 in | Wt 202.0 lb

## 2023-07-11 DIAGNOSIS — I10 Essential (primary) hypertension: Secondary | ICD-10-CM | POA: Insufficient documentation

## 2023-07-11 DIAGNOSIS — I2510 Atherosclerotic heart disease of native coronary artery without angina pectoris: Secondary | ICD-10-CM | POA: Insufficient documentation

## 2023-07-11 DIAGNOSIS — E785 Hyperlipidemia, unspecified: Secondary | ICD-10-CM

## 2023-07-11 LAB — COMPREHENSIVE METABOLIC PANEL
ALT: 46 U/L — ABNORMAL HIGH (ref 0–44)
AST: 34 U/L (ref 15–41)
Albumin: 4.6 g/dL (ref 3.5–5.0)
Alkaline Phosphatase: 82 U/L (ref 38–126)
Anion gap: 8 (ref 5–15)
BUN: 12 mg/dL (ref 6–20)
CO2: 25 mmol/L (ref 22–32)
Calcium: 9.4 mg/dL (ref 8.9–10.3)
Chloride: 102 mmol/L (ref 98–111)
Creatinine, Ser: 0.93 mg/dL (ref 0.61–1.24)
GFR, Estimated: >60 mL/min (ref >60)
Glucose, Bld: 105 mg/dL — ABNORMAL HIGH (ref 70–99)
Potassium: 4.1 mmol/L (ref 3.5–5.1)
Sodium: 135 mmol/L (ref 135–145)
Total Bilirubin: 1.1 mg/dL (ref 0.0–1.2)
Total Protein: 7.9 g/dL (ref 6.5–8.1)

## 2023-07-11 LAB — CBC
HCT: 49 % (ref 39.0–52.0)
Hemoglobin: 16.8 g/dL (ref 13.0–17.0)
MCH: 30.8 pg (ref 26.0–34.0)
MCHC: 34.3 g/dL (ref 30.0–36.0)
MCV: 89.9 fL (ref 80.0–100.0)
Platelets: 250 10*3/uL (ref 150–400)
RBC: 5.45 MIL/uL (ref 4.22–5.81)
RDW: 12.4 % (ref 11.5–15.5)
WBC: 7.9 10*3/uL (ref 4.0–10.5)
nRBC: 0 % (ref 0.0–0.2)

## 2023-07-11 LAB — HEMOGLOBIN A1C
Hgb A1c MFr Bld: 4.9 % (ref 4.8–5.6)
Mean Plasma Glucose: 93.93 mg/dL

## 2023-07-11 NOTE — Patient Instructions (Signed)
Medication Instructions:  Your physician recommends that you continue on your current medications as directed. Please refer to the Current Medication list given to you today.  *If you need a refill on your cardiac medications before your next appointment, please call your pharmacy*   Lab Work: Your physician recommends that you return for lab work in: Tomorrow   If you have labs (blood work) drawn today and your tests are completely normal, you will receive your results only by: MyChart Message (if you have MyChart) OR A paper copy in the mail If you have any lab test that is abnormal or we need to change your treatment, we will call you to review the results.   Testing/Procedures: NONE    Follow-Up: At [facility], you and your health needs are our priority.  As part of our continuing mission to provide you with exceptional heart care, we have created designated Provider Care Teams.  These Care Teams include your primary Cardiologist (physician) and Advanced Practice Providers (APPs -  Physician Assistants and Nurse Practitioners) who all work together to provide you with the care you need, when you need it.  We recommend signing up for the patient portal called "MyChart".  Sign up information is provided on this After Visit Summary.  MyChart is used to connect with patients for Virtual Visits (Telemedicine).  Patients are able to view lab/test results, encounter notes, upcoming appointments, etc.  Non-urgent messages can be sent to your provider as well.   To learn more about what you can do with MyChart, go to [email or web address].    Your next appointment:    Summer   Provider:   You may see [name], MD or one of the following Advanced Practice Providers on your designated Care Team:   Randall An, PA-C  [name], PA-C     Other Instructions Thank you for choosing  HeartCare!

## 2023-07-13 ENCOUNTER — Other Ambulatory Visit: Payer: Self-pay | Admitting: Internal Medicine

## 2023-07-13 ENCOUNTER — Encounter: Payer: Self-pay | Admitting: Internal Medicine

## 2023-07-13 LAB — MISC LABCORP TEST (SEND OUT): Labcorp test code: 884247

## 2023-07-15 NOTE — Telephone Encounter (Signed)
COnfirm if he is taking 1/2 maxzide   or 1 maxzide       If on 1/2   Increase to 1 whole

## 2023-07-16 ENCOUNTER — Other Ambulatory Visit: Payer: Self-pay

## 2023-07-16 MED ORDER — TRIAMTERENE-HCTZ 37.5-25 MG PO TABS: 1.0000 | ORAL_TABLET | Freq: Every day | ORAL | 3 refills | Status: DC

## 2023-08-12 ENCOUNTER — Other Ambulatory Visit: Payer: Self-pay | Admitting: Physician Assistant

## 2023-08-12 ENCOUNTER — Other Ambulatory Visit: Payer: Self-pay | Admitting: Internal Medicine

## 2023-08-15 ENCOUNTER — Other Ambulatory Visit: Payer: Self-pay | Admitting: Internal Medicine

## 2023-10-07 ENCOUNTER — Ambulatory Visit: Payer: BC Managed Care – PPO | Admitting: Internal Medicine

## 2023-11-28 ENCOUNTER — Ambulatory Visit: Admitting: Family Medicine

## 2023-11-28 ENCOUNTER — Encounter: Payer: Self-pay | Admitting: Family Medicine

## 2023-11-28 ENCOUNTER — Telehealth: Payer: Self-pay | Admitting: Family Medicine

## 2023-11-28 VITALS — BP 137/83 | HR 78 | Ht 71.0 in | Wt 188.0 lb

## 2023-11-28 DIAGNOSIS — E559 Vitamin D deficiency, unspecified: Secondary | ICD-10-CM

## 2023-11-28 DIAGNOSIS — R7301 Impaired fasting glucose: Secondary | ICD-10-CM

## 2023-11-28 DIAGNOSIS — N401 Enlarged prostate with lower urinary tract symptoms: Secondary | ICD-10-CM

## 2023-11-28 DIAGNOSIS — I10 Essential (primary) hypertension: Secondary | ICD-10-CM

## 2023-11-28 DIAGNOSIS — R338 Other retention of urine: Secondary | ICD-10-CM

## 2023-11-28 DIAGNOSIS — Z1159 Encounter for screening for other viral diseases: Secondary | ICD-10-CM

## 2023-11-28 DIAGNOSIS — R0602 Shortness of breath: Secondary | ICD-10-CM

## 2023-11-28 DIAGNOSIS — E038 Other specified hypothyroidism: Secondary | ICD-10-CM | POA: Diagnosis not present

## 2023-11-28 DIAGNOSIS — Z1211 Encounter for screening for malignant neoplasm of colon: Secondary | ICD-10-CM

## 2023-11-28 DIAGNOSIS — N400 Enlarged prostate without lower urinary tract symptoms: Secondary | ICD-10-CM | POA: Diagnosis not present

## 2023-11-28 DIAGNOSIS — Z0001 Encounter for general adult medical examination with abnormal findings: Secondary | ICD-10-CM

## 2023-11-28 MED ORDER — NITROGLYCERIN 0.4 MG SL SUBL: SUBLINGUAL_TABLET | SUBLINGUAL | 6 refills | Status: AC

## 2023-11-28 NOTE — Progress Notes (Signed)
   New Patient Office Visit   Subjective   Patient ID: Luis Chang, male    DOB: 30-Nov-1975  Age: 48 y.o. MRN: [id number]  CC: No chief complaint on file.   HPI MICKLE CAMPTON presents to establish care. He  has a past medical history of Bicuspid aortic valve (11/28/2019), CAD (coronary artery disease) (11/30/2019), Dyslipidemia (high LDL; low HDL) (11/30/2019), History of nicotine  vaping, HTN (hypertension), Sleep walking, and Tobacco abuse.  HPI    Outpatient Encounter Medications as of 11/28/2023  Medication Sig   acetaminophen  (TYLENOL ) 500 MG tablet Take 500 mg by mouth daily as needed for headache.   amLODipine  (NORVASC ) 10 MG tablet TAKE 1 TABLET BY MOUTH EVERY DAY   aspirin  EC 81 MG EC tablet Take 1 tablet (81 mg total) by mouth daily.   Esomeprazole Magnesium (NEXIUM PO) Take 1 capsule by mouth daily in the afternoon.   Ibuprofen 200 MG CAPS Take 1 capsule by mouth daily as needed (headache).   metoprolol  succinate (TOPROL -XL) 25 MG 24 hr tablet TAKE 1 TABLET (25 MG TOTAL) BY MOUTH DAILY.   nitroGLYCERIN  (NITROSTAT ) 0.4 MG SL tablet PLACE 1 TABLET UNDER THE TONGUE EVERY 5 (FIVE) MINUTES X 3 DOSES AS NEEDED FOR CHEST PAIN.   rosuvastatin  (CRESTOR ) 40 MG tablet TAKE 1 TABLET BY MOUTH EVERY DAY   sildenafil  (VIAGRA ) 50 MG tablet TAKE 1 TABLET BY MOUTH DAILY AS NEEDED FOR ERECTILE DYSFUNCTION   triamterene -hydrochlorothiazide (MAXZIDE-25) 37.5-25 MG tablet TAKE 1/2 A TABLET DAILY   No facility-administered encounter medications on file as of 11/28/2023.    Past Surgical History:  Procedure Laterality Date   FOREIGN BODY REMOVAL  2007   RIGHT HAND   LEFT HEART CATH AND CORONARY ANGIOGRAPHY N/A 11/30/2019   Procedure: LEFT HEART CATH AND CORONARY ANGIOGRAPHY;  Surgeon: Sammy Crisp, MD;  [facility]: [facility] IN[facility] CV [facility];  Service: Cardiovascular;  Laterality: N/A;   WISDOM TOOTH EXTRACTION      ROS    Objective    There were no vitals taken for this visit.  Physical Exam     Assessment & Plan:  There are no diagnoses linked to this encounter.  No follow-ups on file.   Avelino Lek Amber Bail, FNP

## 2023-11-28 NOTE — Patient Instructions (Signed)

## 2023-11-28 NOTE — Telephone Encounter (Signed)
 Luis Chang

## 2023-11-28 NOTE — Progress Notes (Signed)
 Complete physical exam  Patient: Luis Chang   DOB: [birth year]   48 y.o. Male  MRN: [id number]  Subjective:     Chief Complaint  Patient presents with   Establish Care    Luis Chang is a 48 y.o. male who presents today for a complete physical exam. He reports consuming a general diet. The patient has a physically strenuous job, but has no regular exercise apart from work.  He generally feels well. He reports sleeping well. He does have additional problems to discuss today.    Most recent fall risk assessment:    11/28/2023    9:56 AM  Fall Risk   Falls in the past year? 0  Number falls in past yr: 0  Injury with Fall? 0  Risk for fall due to : No Fall Risks  Follow up Falls evaluation completed     Most recent depression screenings:    11/28/2023    9:56 AM  PHQ 2/9 Scores  PHQ - 2 Score 0  PHQ- 9 Score 0    Vision:Not within last year  and Dental: Receives regular dental care  Patient Care Team: Del Amber Bail, Rogerio Clay, FNP as PCP - General (Family Medicine) Elmyra Haggard, MD as PCP - Cardiology (Cardiology)   Outpatient Medications Prior to Visit  Medication Sig   amLODipine  (NORVASC ) 10 MG tablet TAKE 1 TABLET BY MOUTH EVERY DAY   aspirin  EC 81 MG EC tablet Take 1 tablet (81 mg total) by mouth daily.   Esomeprazole Magnesium (NEXIUM PO) Take 1 capsule by mouth daily in the afternoon.   metoprolol  succinate (TOPROL -XL) 25 MG 24 hr tablet TAKE 1 TABLET (25 MG TOTAL) BY MOUTH DAILY.   rosuvastatin  (CRESTOR ) 40 MG tablet TAKE 1 TABLET BY MOUTH EVERY DAY   sildenafil  (VIAGRA ) 50 MG tablet TAKE 1 TABLET BY MOUTH DAILY AS NEEDED FOR ERECTILE DYSFUNCTION   triamterene -hydrochlorothiazide (MAXZIDE-25) 37.5-25 MG tablet TAKE 1/2 A TABLET DAILY   [DISCONTINUED] nitroGLYCERIN  (NITROSTAT ) 0.4 MG SL tablet PLACE 1 TABLET UNDER THE TONGUE EVERY 5 (FIVE) MINUTES X 3 DOSES AS NEEDED FOR CHEST PAIN.   acetaminophen  (TYLENOL ) 500 MG tablet Take 500 mg by mouth daily as needed  for headache.   Ibuprofen 200 MG CAPS Take 1 capsule by mouth daily as needed (headache).   No facility-administered medications prior to visit.    Review of Systems  Constitutional:  Negative for fever and malaise/fatigue.  HENT:  Negative for ear pain.   Eyes:  Negative for blurred vision.  Respiratory:  Negative for shortness of breath.   Cardiovascular:  Negative for chest pain.  Gastrointestinal:  Negative for constipation and diarrhea.  Genitourinary:  Negative for dysuria.  Musculoskeletal:  Negative for myalgias.  Neurological:  Negative for dizziness and headaches.       Objective:    BP 137/83   Pulse 78   Ht 5\' 11"  (1.803 m)   Wt 188 lb (85.3 kg)   SpO2 98%   BMI 26.22 kg/m  BP Readings from Last 3 Encounters:  11/28/23 137/83  07/11/23 136/84  06/04/22 124/82      Physical Exam Vitals reviewed.  Constitutional:      General: He is not in acute distress.    Appearance: Normal appearance. He is not ill-appearing, toxic-appearing or diaphoretic.  HENT:     Head: Normocephalic.     Right Ear: Tympanic membrane normal.     Left Ear: Tympanic membrane normal.  Mouth/Throat:     Mouth: Mucous membranes are moist.     Pharynx: No oropharyngeal exudate.  Eyes:     General:        Right eye: No discharge.        Left eye: No discharge.     Conjunctiva/sclera: Conjunctivae normal.     Pupils: Pupils are equal, round, and reactive to light.  Cardiovascular:     Rate and Rhythm: Normal rate.     Pulses: Normal pulses.     Heart sounds: Normal heart sounds.  Pulmonary:     Effort: Pulmonary effort is normal. No respiratory distress.     Breath sounds: Normal breath sounds.  Abdominal:     General: Bowel sounds are normal.     Palpations: Abdomen is soft.     Tenderness: There is no abdominal tenderness. There is no right CVA tenderness, left CVA tenderness or guarding.  Musculoskeletal:        General: Normal range of motion.     Cervical back:  Normal range of motion.  Skin:    General: Skin is warm and dry.  Neurological:     Mental Status: He is alert.     Coordination: Coordination normal.     Gait: Gait normal.  Psychiatric:        Mood and Affect: Mood normal.        Behavior: Behavior normal.      No results found for any visits on 11/28/23.    Assessment & Plan:    Routine Health Maintenance and Physical Exam  Immunization History  Administered Date(s) Administered   Tdap 02/04/2014    Health Maintenance  Topic Date Due   COVID-19 Vaccine (1) Never done   Hepatitis C Screening  Never done   Pneumococcal Vaccine 71-32 Years old (1 of 2 - PCV) Never done   Colonoscopy  Never done   INFLUENZA VACCINE  01/24/2024   DTaP/Tdap/Td (2 - Td or Tdap) 02/05/2024   HIV Screening  Completed   HPV VACCINES  Aged Out   Meningococcal B Vaccine  Aged Out    Discussed health benefits of physical activity, and encouraged him to engage in regular exercise appropriate for his age and condition.  Primary hypertension -     Lipid panel -     CMP14+EGFR -     CBC with Differential/Platelet  TSH (thyroid -stimulating hormone deficiency) -     TSH + free T4  IFG (impaired fasting glucose)  Vitamin D deficiency -     VITAMIN D 25 Hydroxy (Vit-D Deficiency, Fractures)  Screen for colon cancer -     Cologuard  Need for hepatitis C screening test -     Hepatitis C antibody  Benign prostatic hyperplasia with urinary retention -     PSA  SOB (shortness of breath) -     Brain natriuretic peptide  Encounter for routine adult physical exam with abnormal findings Assessment & Plan: A comprehensive physical examination was completed, and necessary labs were ordered. Screening and health maintenance recommendations have been updated. The patient received counseling on exercise and nutrition. BMI was assessed and discussed Advise for heart health, focus on: Eat more fruits and vegetables: Aim for a variety of  colors. Choose whole grains: Brown rice, oats, and whole-wheat bread. Limit unhealthy fats: Avoid trans fats; use olive or avocado oil instead. Include lean proteins: Opt for fish, chicken, beans, and legumes. Reduce sodium: Limit processed foods and add less salt. Stay hydrated:  Drink plenty of water. Exercise regularly: Aim for at least 30 minutes of moderate exercise, like walking or cycling, 5 days a week.     Other orders -     Nitroglycerin ; PLACE 1 TABLET UNDER THE TONGUE EVERY 5 (FIVE) MINUTES X 3 DOSES AS NEEDED FOR CHEST PAIN.  Dispense: 25 tablet; Refill: 6    Return in about 4 months (around 03/29/2024), or if symptoms worsen or fail to improve, for chronic follow-up.     Avelino Lek Amber Bail, FNP

## 2023-11-28 NOTE — Assessment & Plan Note (Signed)

## 2023-11-29 LAB — LIPID PANEL: Chol/HDL Ratio: 2.1 ratio (ref 0.0–5.0)

## 2023-11-30 LAB — CBC WITH DIFFERENTIAL/PLATELET
Basophils Absolute: 0.1 10*3/uL (ref 0.0–0.2)
Basos: 1 %
EOS (ABSOLUTE): 0.2 10*3/uL (ref 0.0–0.4)
Eos: 2 %
Hematocrit: 51.1 % — ABNORMAL HIGH (ref 37.5–51.0)
Hemoglobin: 16.6 g/dL (ref 13.0–17.7)
Immature Grans (Abs): 0 10*3/uL (ref 0.0–0.1)
Immature Granulocytes: 0 %
Lymphocytes Absolute: 2.8 10*3/uL (ref 0.7–3.1)
Lymphs: 28 %
MCH: 30.9 pg (ref 26.6–33.0)
MCHC: 32.5 g/dL (ref 31.5–35.7)
MCV: 95 fL (ref 79–97)
Monocytes Absolute: 0.9 10*3/uL (ref 0.1–0.9)
Monocytes: 9 %
Neutrophils Absolute: 6 10*3/uL (ref 1.4–7.0)
Neutrophils: 60 %
Platelets: 264 10*3/uL (ref 150–450)
RBC: 5.37 x10E6/uL (ref 4.14–5.80)
RDW: 12.7 % (ref 11.6–15.4)
WBC: 10 10*3/uL (ref 3.4–10.8)

## 2023-11-30 LAB — CMP14+EGFR
ALT: 58 IU/L — ABNORMAL HIGH (ref 0–44)
AST: 44 IU/L — ABNORMAL HIGH (ref 0–40)
Albumin: 5.2 g/dL — ABNORMAL HIGH (ref 4.1–5.1)
Alkaline Phosphatase: 96 IU/L (ref 44–121)
BUN/Creatinine Ratio: 8 — ABNORMAL LOW (ref 9–20)
BUN: 9 mg/dL (ref 6–24)
Bilirubin Total: 0.7 mg/dL (ref 0.0–1.2)
CO2: 25 mmol/L (ref 20–29)
Calcium: 10.1 mg/dL (ref 8.7–10.2)
Chloride: 99 mmol/L (ref 96–106)
Creatinine, Ser: 1.06 mg/dL (ref 0.76–1.27)
Globulin, Total: 2.4 g/dL (ref 1.5–4.5)
Glucose: 90 mg/dL (ref 70–99)
Potassium: 4.7 mmol/L (ref 3.5–5.2)
Sodium: 138 mmol/L (ref 134–144)
Total Protein: 7.6 g/dL (ref 6.0–8.5)
eGFR: 86 mL/min/{1.73_m2} (ref >59)

## 2023-11-30 LAB — PSA: Prostate Specific Ag, Serum: 0.5 ng/mL (ref 0.0–4.0)

## 2023-11-30 LAB — LIPID PANEL
Chol/HDL Ratio: 2.1 ratio (ref 0.0–5.0)
Cholesterol, Total: 141 mg/dL (ref 100–199)
HDL: 68 mg/dL (ref >39)
LDL Chol Calc (NIH): 57 mg/dL (ref 0–99)
Triglycerides: 86 mg/dL (ref 0–149)
VLDL Cholesterol Cal: 16 mg/dL (ref 5–40)

## 2023-11-30 LAB — TSH+FREE T4
Free T4: 1.35 ng/dL (ref 0.82–1.77)
TSH: 1.04 u[IU]/mL (ref 0.450–4.500)

## 2023-11-30 LAB — BRAIN NATRIURETIC PEPTIDE: BNP: 5.2 pg/mL (ref 0.0–100.0)

## 2023-11-30 LAB — VITAMIN D 25 HYDROXY (VIT D DEFICIENCY, FRACTURES): Vit D, 25-Hydroxy: 30.6 ng/mL (ref 30.0–100.0)

## 2023-11-30 LAB — HEPATITIS C ANTIBODY: Hep C Virus Ab: NONREACTIVE

## 2023-12-03 ENCOUNTER — Ambulatory Visit: Payer: Self-pay | Admitting: Family Medicine

## 2024-01-10 ENCOUNTER — Other Ambulatory Visit: Payer: Self-pay | Admitting: Internal Medicine

## 2024-03-30 ENCOUNTER — Ambulatory Visit

## 2024-03-30 VITALS — BP 127/84 | HR 90 | Ht 71.0 in | Wt 189.0 lb

## 2024-03-30 DIAGNOSIS — I10 Essential (primary) hypertension: Secondary | ICD-10-CM | POA: Diagnosis not present

## 2024-03-30 NOTE — Progress Notes (Unsigned)
 Established Patient Office Visit  Subjective   Patient ID: Luis Chang, male    DOB: [birth year]  Age: 48 y.o. MRN: [id number]  Chief Complaint  Patient presents with   Medical Management of Chronic Issues    4 month follow up    HPI Discussed the use of AI scribe software for clinical note transcription with the patient, who gave verbal consent to proceed.  History of Present Illness   Luis Chang is a 48 year old male with hypertension and coronary artery disease who presents for a follow-up visit.  Hypertension and coronary artery disease - Hypertension and coronary artery disease are present. - No recent use of nitroglycerin . - Last cardiology visit was in January; next appointment is unknown.  Immunization status - No influenza vaccination in the past thirty years. - Declined influenza vaccination during this visit.      Patient Active Problem List   Diagnosis Date Noted   Primary hypertension 04/02/2024   Encounter for routine adult physical exam with abnormal findings 11/28/2023   Dyslipidemia (high LDL; low HDL) 11/30/2019   Unstable angina (HCC) 11/27/2019   Tobacco use disorder 02/04/2014   Distal radial fracture 02/13/2012   Closed fracture of metacarpal bone 05/08/2010   Closed fracture of shaft of metacarpal bone 05/08/2010   CLOS FRACTURE MID/PROXIMAL PHALANX/PHALANG HAND 05/08/2010   FRACTURE, TIBIAL PLATEAU 05/08/2010    ROS    Objective:     BP 127/84   Pulse 90   Ht 5' 11 (1.803 m)   Wt 189 lb 0.6 oz (85.7 kg)   SpO2 98%   BMI 26.37 kg/m  BP Readings from Last 3 Encounters:  03/30/24 127/84  11/28/23 137/83  07/11/23 136/84   Wt Readings from Last 3 Encounters:  03/30/24 189 lb 0.6 oz (85.7 kg)  11/28/23 188 lb (85.3 kg)  07/11/23 202 lb (91.6 kg)      Physical Exam Vitals and nursing note reviewed.  Constitutional:      Appearance: Normal appearance.  HENT:     Head: Normocephalic.  Eyes:     Extraocular Movements:  Extraocular movements intact.     Pupils: Pupils are equal, round, and reactive to light.  Cardiovascular:     Rate and Rhythm: Normal rate and regular rhythm.  Pulmonary:     Effort: Pulmonary effort is normal.     Breath sounds: Normal breath sounds.  Musculoskeletal:     Cervical back: Normal range of motion and neck supple.  Neurological:     Mental Status: He is alert and oriented to person, place, and time.  Psychiatric:        Mood and Affect: Mood normal.        Thought Content: Thought content normal.    Last CBC Lab Results  Component Value Date   WBC 10.0 11/28/2023   HGB 16.6 11/28/2023   HCT 51.1 (H) 11/28/2023   MCV 95 11/28/2023   MCH 30.9 11/28/2023   RDW 12.7 11/28/2023   PLT 264 11/28/2023   Last metabolic panel Lab Results  Component Value Date   GLUCOSE 81 03/30/2024   NA 139 03/30/2024   K 4.0 03/30/2024   CL 99 03/30/2024   CO2 23 03/30/2024   BUN 13 03/30/2024   CREATININE 1.05 03/30/2024   EGFR 86 03/30/2024   CALCIUM  9.9 03/30/2024   PROT 7.5 03/30/2024   ALBUMIN 5.0 03/30/2024   LABGLOB 2.5 03/30/2024   BILITOT 0.9 03/30/2024   ALKPHOS 103  03/30/2024   AST 47 (H) 03/30/2024   ALT 59 (H) 03/30/2024   ANIONGAP 8 07/11/2023   Last lipids Lab Results  Component Value Date   CHOL 141 11/28/2023   HDL 68 11/28/2023   LDLCALC 57 11/28/2023   TRIG 86 11/28/2023   CHOLHDL 2.1 11/28/2023   Last hemoglobin A1c Lab Results  Component Value Date   HGBA1C 4.9 07/11/2023   Last thyroid  functions Lab Results  Component Value Date   TSH 1.040 11/28/2023   Last vitamin D  Lab Results  Component Value Date   VD25OH 30.6 11/28/2023   Last vitamin B12 and Folate No results found for: VITAMINB12, FOLATE    The 10-year ASCVD risk score (Arnett DK, et al., 2019) is: 1.8%    Assessment & Plan:   Problem List Items Addressed This Visit       Cardiovascular and Mediastinum   Primary hypertension - Primary   Stable with current  medications.  No changes made today.  Will recheck CMP today to recheck elevated liver enzymes seen on previous labs.           Relevant Orders   CMP14+EGFR (Completed)   Return in about 6 months (around 09/28/2024) for chronic follow-up with PCP.    Leita Longs, FNP

## 2024-03-31 LAB — CMP14+EGFR
ALT: 59 IU/L — ABNORMAL HIGH (ref 0–44)
AST: 47 IU/L — ABNORMAL HIGH (ref 0–40)
Albumin: 5 g/dL (ref 4.1–5.1)
Alkaline Phosphatase: 103 IU/L (ref 47–123)
BUN/Creatinine Ratio: 12 (ref 9–20)
BUN: 13 mg/dL (ref 6–24)
Bilirubin Total: 0.9 mg/dL (ref 0.0–1.2)
CO2: 23 mmol/L (ref 20–29)
Calcium: 9.9 mg/dL (ref 8.7–10.2)
Chloride: 99 mmol/L (ref 96–106)
Creatinine, Ser: 1.05 mg/dL (ref 0.76–1.27)
Globulin, Total: 2.5 g/dL (ref 1.5–4.5)
Glucose: 81 mg/dL (ref 70–99)
Potassium: 4 mmol/L (ref 3.5–5.2)
Sodium: 139 mmol/L (ref 134–144)
Total Protein: 7.5 g/dL (ref 6.0–8.5)
eGFR: 86 mL/min/1.73 (ref >59)

## 2024-04-02 ENCOUNTER — Ambulatory Visit: Payer: Self-pay

## 2024-04-02 DIAGNOSIS — I10 Essential (primary) hypertension: Secondary | ICD-10-CM | POA: Insufficient documentation

## 2024-04-02 NOTE — Assessment & Plan Note (Signed)
 Stable with current medications.  No changes made today.  Will recheck CMP today to recheck elevated liver enzymes seen on previous labs.

## 2024-04-10 ENCOUNTER — Other Ambulatory Visit: Payer: Self-pay | Admitting: Internal Medicine

## 2024-04-16 ENCOUNTER — Ambulatory Visit: Admitting: Student

## 2024-05-27 ENCOUNTER — Telehealth: Admitting: Family Medicine

## 2024-05-27 DIAGNOSIS — B9689 Other specified bacterial agents as the cause of diseases classified elsewhere: Secondary | ICD-10-CM

## 2024-05-27 MED ORDER — AMOXICILLIN-POT CLAVULANATE 875-125 MG PO TABS: 1.0000 | ORAL_TABLET | Freq: Two times a day (BID) | ORAL | 0 refills | Status: AC

## 2024-05-27 NOTE — Progress Notes (Signed)
 E-Visit for Sinus Problems  We are sorry that you are not feeling well.  Here is how we plan to help!  Based on what you have shared with me it looks like you have sinusitis.  Sinusitis is inflammation and infection in the sinus cavities of the head.  Based on your presentation I believe you most likely have Acute Bacterial Sinusitis.  This is an infection caused by bacteria and is treated with antibiotics. I have prescribed Augmentin  875mg /125mg  one tablet twice daily with food, for 7 days. You may use an oral decongestant such as Mucinex D or if you have glaucoma or high blood pressure use plain Mucinex. Saline nasal spray help and can safely be used as often as needed for congestion.  If you develop worsening sinus pain, fever or notice severe headache and vision changes, or if symptoms are not better after completion of antibiotic, please schedule an appointment with a health care provider.    Sinus infections are not as easily transmitted as other respiratory infection, however we still recommend that you avoid close contact with loved ones, especially the very young and elderly.  Remember to wash your hands thoroughly throughout the day as this is the number one way to prevent the spread of infection!  Home Care: Only take medications as instructed by your medical team. Complete the entire course of an antibiotic. Do not take these medications with alcohol. A steam or ultrasonic humidifier can help congestion.  You can place a towel over your head and breathe in the steam from hot water coming from a faucet. Avoid close contacts especially the very young and the elderly. Cover your mouth when you cough or sneeze. Always remember to wash your hands.  Get Help Right Away If: You develop worsening fever or sinus pain. You develop a severe head ache or visual changes. Your symptoms persist after you have completed your treatment plan.  Make sure you Understand these instructions. Will  watch your condition. Will get help right away if you are not doing well or get worse.  Your e-visit answers were reviewed by a board certified advanced clinical practitioner to complete your personal care plan.  Depending on the condition, your plan could have included both over the counter or prescription medications.  If there is a problem please reply  once you have received a response from your provider.  Your safety is important to us .  If you have drug allergies check your prescription carefully.    You can use MyChart to ask questions about today's visit, request a non-urgent call back, or ask for a work or school excuse for 24 hours related to this e-Visit. If it has been greater than 24 hours you will need to follow up with your provider, or enter a new e-Visit to address those concerns.  You will get an e-mail in the next two days asking about your experience.  I hope that your e-visit has been valuable and will speed your recovery. Thank you for using e-visits.  I have spent 5 minutes in review of e-visit questionnaire, review and updating patient chart, medical decision making and response to patient.   Chiquita CHRISTELLA Barefoot, NP

## 2024-07-05 ENCOUNTER — Other Ambulatory Visit: Payer: Self-pay | Admitting: Internal Medicine

## 2024-07-31 ENCOUNTER — Other Ambulatory Visit: Payer: Self-pay | Admitting: Physician Assistant

## 2024-07-31 ENCOUNTER — Other Ambulatory Visit: Payer: Self-pay | Admitting: Internal Medicine

## 2024-09-28 ENCOUNTER — Ambulatory Visit
